# Patient Record
Sex: Female | Born: 1980 | Smoking: Never smoker
Health system: Southern US, Community
[De-identification: ages and names within clinical notes are randomized; demographics above are authoritative.]

## PROBLEM LIST (undated history)

## (undated) DIAGNOSIS — F419 Anxiety disorder, unspecified: Secondary | ICD-10-CM

## (undated) DIAGNOSIS — F329 Major depressive disorder, single episode, unspecified: Secondary | ICD-10-CM

## (undated) DIAGNOSIS — F909 Attention-deficit hyperactivity disorder, unspecified type: Secondary | ICD-10-CM

## (undated) DIAGNOSIS — F32A Depression, unspecified: Secondary | ICD-10-CM

## (undated) HISTORY — DX: Major depressive disorder, single episode, unspecified: F32.9

## (undated) HISTORY — PX: WISDOM TOOTH EXTRACTION: SHX21

## (undated) HISTORY — DX: Attention-deficit hyperactivity disorder, unspecified type: F90.9

## (undated) HISTORY — DX: Depression, unspecified: F32.A

---

## 2013-12-28 ENCOUNTER — Ambulatory Visit (INDEPENDENT_AMBULATORY_CARE_PROVIDER_SITE_OTHER): Payer: 59 | Admitting: Psychiatry

## 2013-12-28 ENCOUNTER — Encounter (HOSPITAL_COMMUNITY): Payer: Self-pay | Admitting: Psychiatry

## 2013-12-28 VITALS — BP 116/78 | HR 98 | Ht 69.0 in | Wt 148.0 lb

## 2013-12-28 DIAGNOSIS — F331 Major depressive disorder, recurrent, moderate: Secondary | ICD-10-CM

## 2013-12-28 MED ORDER — CLONAZEPAM 0.5 MG PO TABS: 0.5000 mg | ORAL_TABLET | Freq: Two times a day (BID) | ORAL | Status: DC | PRN

## 2013-12-28 MED ORDER — LISDEXAMFETAMINE DIMESYLATE 30 MG PO CAPS: 30.0000 mg | ORAL_CAPSULE | Freq: Every day | ORAL | Status: DC

## 2013-12-28 MED ORDER — BUPROPION HCL ER (XL) 300 MG PO TB24: 300.0000 mg | ORAL_TABLET | Freq: Every day | ORAL | Status: DC

## 2013-12-28 NOTE — Progress Notes (Signed)
Patient ID: Dawn Moreno, female   DOB: 08/13/1980, 33 y.o.   MRN: [id number]  [facility] Initial Assessment Note  Dawn Moreno [id number] 33 y.o.  12/28/2013 2:57 PM  Chief Complaint:  I need to establish my care.  I recently moved and I need a new psychiatrist.  History of Present Illness:  Patient is 33 year old Caucasian, employed married female who is self-referred,  came today with her husband her seeking management of her psychiatric illness.  Patient has a long history of depression and anxiety symptoms.  She was seeing a psychiatrist in [place] until recently she moved to this area and now need a new psychiatrist.  Currently she is taking Wellbutrin 450 mg in the morning, Deplin 50 mg daily, Zoloft 25 mg daily and Klonopin 0.5 mg twice a day as needed.  Patient endorses despite taking these medications she continues to have lack of motivation, difficulty doing multitasking, unable to focus and organize herself.  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  She started this job 2 months ago and she admitted having issues at work.  She gets easily overwhelmed and then get very anxious.  She endorsed sometimes feeling hopeless, crying spells, lack of interest and excessive worries.  She remembered in the past taking high dose of Adderall however it was discontinued when she started abusing them and running out before her next refill.  She remembered taking Adderall 90 mg which was given by her psychiatrist in [place].  She admitted going through withdrawal and her husband decided not to take anymore.  Patient currently feels her depression is under control however she is to have anxiety and lack of motivation.  Her energy level is low.  She denies any paranoia, hallucination, active or passive suicidal thoughts or homicidal thoughts.  Patient told that she has diagnosed in the past with bipolar disorder by 2 psychiatrist however she do not  have any symptoms of mania or any impulsive behavior.  She never had any psychological testing.  She mentioned that she was given Adderall and Vyvanse in the past however she was not sure if she ever diagnosed with ADD.  Patient admitted sometime poor sleep and racing thoughts but denies any anhedonia, hopeless or worthless feeling.  Patient appears overwhelmed as she is concerned about her job as she is unable to complete the task on time.  Patient does drink alcohol 2-3 times a week but denies any binge drinking or any withdrawal symptoms.  She denies any illegal substance use.  She denies any paranoia or any hallucination.  She denies any history of sexual, well-built, emotional abuse.  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  In the beginning she was given Xanax by her primary care physician and then she saw psychiatrist in 2010 who started her on Zyprexa, Prozac, Lamictal, Xanax and Adderall.  In 2012 she was given Abilify by a different psychiatrist and she was given Vyvanse along with Adderall.  Patient has significant withdrawal symptoms when she was running out of Adderall.  She admitted using Adderall more than she was prescribed because she was really tired.  She admitted to history of irritability, impulsive behavior when she was on  Adderall.  She remembers Zyprexa Prozac and Lamictal was making her more tired and her psychiatrist were giving Adderall to help the energy level.  In January 2015 she did intensive outpatient program and partial hospitalization program at [facility] in [place].  At that time her Adderall was discontinued and she was started on Wellbutrin which was gradually increased to 450 mg.   Later her primary care physician added Deplin 15 mg in June 2015.  She is also taking Klonopin 0.5 mg which she takes up to 1 mg as needed to help the anxiety.  This denies any history of suicidal attempt, inpatient psych treatment, paranoia, hallucination or any manic like symptoms. Anxiety: Yes Bipolar Disorder: Patient has diagnosed bipolar disorder by 2 previous psychiatrists however she denies any history of mania and psychosis. Depression: Yes Mania: No Psychosis: No Schizophrenia: No Personality Disorder: No Hospitalization for psychiatric illness: No History of Electroconvulsive Shock Therapy: No Prior Suicide Attempts: No  Medical History; Patient is prediabetic.  She does not have any active medical illness.  She has no primary care physician.  Traumatic brain injury: Patient denies any history of dramatic brain injury.  Family History; Patient endorsed father has depression.  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  Psychosocial History; Patient was born in [place] and grew up in [place].  She had moved to places in the past.  She's mad at him 2008.  Her husband is very supportive.  She has no children.  Her father is deceased.  Her mother lives in [place].  She recently moved from [place] with her husband because of new job.  Legal History; Patient denies any history of legal issues.  History Of Abuse; Patient denies any history of abuse.  Substance Abuse History; Patient endorsed occasional social drinking but denies any binge drinking, patrols, tremors, shakes or any intoxication.  She denies any illegal substance use.   Review of Systems: Psychiatric: Agitation: No Hallucination: No Depressed Mood: Yes Insomnia: Yes Hypersomnia: No Altered Concentration: No Feels Worthless: No Grandiose Ideas: No Belief In Special Powers: No New/Increased Substance Abuse:  No Compulsions: No  Neurologic: Headache: No Seizure: No Paresthesias: No   Musculoskeletal: Strength & Muscle Tone: within normal limits Gait & Station: unsteady Patient leans: N/A   Outpatient Encounter Prescriptions as of 12/28/2013  Medication Sig  . buPROPion (WELLBUTRIN XL) 300 MG 24 hr tablet Take 1 tablet (300 mg total) by mouth daily.  . clonazePAM (KLONOPIN) 0.5 MG tablet Take 1 tablet (0.5 mg total) by mouth 2 (two) times daily as needed for anxiety.  Marland Kitchen L-Methylfolate-Algae (DEPLIN 15 PO) Take 15 mg by mouth.  . sertraline (ZOLOFT) 25 MG tablet Take 25 mg by mouth daily.  . [DISCONTINUED] buPROPion (WELLBUTRIN XL) 150 MG 24 hr tablet Take 450 mg by mouth daily.  . [DISCONTINUED] clonazePAM (KLONOPIN) 0.5 MG tablet Take 0.5 mg by mouth 2 (two) times daily as needed for anxiety.  Marland Kitchen lisdexamfetamine (VYVANSE) 30 MG capsule Take 1 capsule (30 mg total) by mouth daily.    No results found for this or any previous visit (from the past 2160 hour(s)).    Constitutional:  BP 116/78  Pulse 98  Ht  (1.753 m)  Wt 148 lb (67.132 kg)  BMI 21.85 kg/m2   Mental Status Examination;  Patient is well groomed and well dressed female who appears to be her stated age.  She is anxious but  cooperative.  She is easily tearful when she is talking about her past history and symptoms of depression and anxiety.  Her thought process logical and goal-directed.  She describes her mood anxious and her affect is mood appropriate.  She denies any auditory or visual hallucination.  She denies any active or passive suicidal thoughts or homicidal thoughts.  There were no paranoia, delusions or any obsessive thoughts.  Her attention and concentration is fair.  She has some difficulty organizing her thoughts.  Her psychomotor activity is normal.  There were no tremors or shakes.  Her fund of knowledge is good.  She is alert and oriented x3.  There were no flight of ideas or any loose association.  Her  insight judgment and impulse control is good   Established Problem, Stable/Improving (1), Review of Psycho-Social Stressors (1), Decision to obtain old records (1), Review and summation of old records (2), Established Problem, Worsening (2), Review of Medication Regimen & Side Effects (2) and Review of New Medication or Change in Dosage (2)  Assessment: Axis I: Mood disorder NOS, rule out major depressive disorder recurrent, rule out ADHD  Axis II: Deferred  Axis III:  History reviewed. No pertinent past medical history.  Axis IV: Moderate   Plan:  I review her symptoms, history, medication and the stressors.  Despite taking Wellbutrin 450 mg and Zoloft she continues to experience anxiety symptoms with lack of focus and lack of motivation.  She admitted had a good response to the stimulant however at that time she was taking very high dose Adderall and she was running out before her next prescription.  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  I recommended to cut down her Wellbutrin 300 mg and try low-dose Vyvanse 30 mg to help attention and concentration.  I also suggested to have psychological testing to help the diagnosis.  The patient and her husband agree with the plan.  We will scheduled appointment with Dr Kieth Brightly for testing.  I will also get records from her previous psychiatrist including any blurred results if they have done.  At this time patient is not interested in counseling however would consider on her next appointment.  Recommended to use Klonopin as needed 0.5 mg twice a day.  We talked about stimulant abuse and withdrawal in detail.  Patient will not get any early refills of benzodiazepine or stimulants.  Recommended to call us back if she has any question or any concern.  I will see her again in 3 weeks. Time spent 55 minutes.  More than 50% of the time spent in psychoeducation, counseling and  coordination of care.  Discuss safety plan that anytime having active suicidal thoughts or homicidal thoughts then patient need to call 911 or go to the local emergency room.    Trejon Duford T., MD 12/28/2013

## 2014-01-05 ENCOUNTER — Other Ambulatory Visit (HOSPITAL_COMMUNITY): Payer: Self-pay | Admitting: Psychiatry

## 2014-01-06 ENCOUNTER — Other Ambulatory Visit (HOSPITAL_COMMUNITY): Payer: Self-pay | Admitting: Psychiatry

## 2014-01-06 MED ORDER — SERTRALINE HCL 25 MG PO TABS: 25.0000 mg | ORAL_TABLET | Freq: Every day | ORAL | Status: DC

## 2014-01-19 ENCOUNTER — Ambulatory Visit (HOSPITAL_COMMUNITY): Payer: 59 | Admitting: Psychology

## 2014-01-21 ENCOUNTER — Other Ambulatory Visit (HOSPITAL_COMMUNITY): Payer: Self-pay | Admitting: *Deleted

## 2014-01-21 NOTE — Telephone Encounter (Signed)
Patient called for Vyvanse refill. Advised pt to wait until appt on 01/26/13 for refill, as could not get refilled unti. 9/23 anyway.Pt thanked this Clinical research associate for [employer].Stated she did not realize her appt was 9/22.Gave pt appt time.Will get Rx then.

## 2014-01-26 ENCOUNTER — Encounter (HOSPITAL_COMMUNITY): Payer: Self-pay

## 2014-01-26 ENCOUNTER — Encounter (HOSPITAL_COMMUNITY): Payer: Self-pay | Admitting: Psychiatry

## 2014-01-26 ENCOUNTER — Ambulatory Visit (INDEPENDENT_AMBULATORY_CARE_PROVIDER_SITE_OTHER): Payer: BC Managed Care – PPO | Admitting: Psychiatry

## 2014-01-26 VITALS — BP 118/81 | HR 73 | Ht 68.0 in | Wt 150.0 lb

## 2014-01-26 DIAGNOSIS — F331 Major depressive disorder, recurrent, moderate: Secondary | ICD-10-CM

## 2014-01-26 DIAGNOSIS — F39 Unspecified mood [affective] disorder: Secondary | ICD-10-CM

## 2014-01-26 MED ORDER — BUPROPION HCL ER (XL) 300 MG PO TB24: 300.0000 mg | ORAL_TABLET | Freq: Every day | ORAL | Status: DC

## 2014-01-26 MED ORDER — CLONAZEPAM 0.5 MG PO TABS: 0.5000 mg | ORAL_TABLET | Freq: Two times a day (BID) | ORAL | Status: DC | PRN

## 2014-01-26 MED ORDER — LISDEXAMFETAMINE DIMESYLATE 40 MG PO CAPS: 40.0000 mg | ORAL_CAPSULE | Freq: Every day | ORAL | Status: DC

## 2014-01-26 MED ORDER — SERTRALINE HCL 25 MG PO TABS: 25.0000 mg | ORAL_TABLET | Freq: Every day | ORAL | Status: DC

## 2014-01-26 NOTE — Progress Notes (Addendum)
[facility] [id number] Progress Note   Dawn Moreno [id number] 33 y.o.  01/26/2014 5:11 PM  Chief Complaint:  I like Vyvanse I feel very tired in the afternoon.  History of Present Illness:  Dawn Moreno came for her followup appointment.  She was seen 4 weeks ago at initial evaluation for the management of depression and ADHD.  Patient is a 33 year old Caucasian female who is self-referred and she is seeking management for her psychiatric illness.  On her visit we recommended to decrease Wellbutrin 300 and a started Vyvanse as patient is not happy with the Adderall.  She was recommended to stop Deplin .  She wants to continue Zoloft 25 mg daily and Klonopin 0.5 mg twice a day.  Since she is taking Vyvanse she is feeling more energy, more motivation and able to do multitasking.  She is able to focus and organize herself.  However in the afternoon she gets really tired and fatigued.  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  She gets sometimes overwhelmed and bloodied.  Patient and her husband recently moved from [place].  We are still waiting for records from her previous psychiatrist.  Patient denies any paranoia, hallucination, chest pain or any active or passive suicidal thoughts or homicidal thoughts.  Patient is scheduled to see a psychologist for psychological testing on October 8.  Patient is sleeping good.  Her appetite and bottles are stable.  The patient does not drink or use any illegal substances.    Suicidal Ideation: No Plan Formed: No Patient has means to carry out plan: No  Homicidal Ideation: No Plan Formed: No Patient has means to carry out plan: No   Past Psychiatric History/Hospitalization(s) She endorsed history of depression since 2007 when she started job .  In the beginning she was given Xanax by her primary care physician.  In 2010 she saw psychiatrist who started her on Zyprexa, Prozac, the victim, Xanax and Adderall.  In  2012 she was given Abilify by different psychiatrist and she also given Vyvanse and Adderall.  The patient has significant withdrawal symptoms when she was about the Adderall.  She admitted history of irritability, impulsive behavior and she was on Adderall.  She remembers Zyprexa Prozac and Lamictal was making her more tired and her psychiatrist for giving Adderall to help in energy level.  In 2015 she had intensive outpatient program and partial hospitalization treatment at [facility] in [place].  Her last prescription was Wellbutrin 450 mg , Klonopin 0.5 mg twice a day and Adderall 20 mg twice a day.  Patient denies any history of suicidal attempt , mania, psychosis , hallucination or any inpatient psychiatric treatment.   Anxiety: Yes Bipolar Disorder: Patient has diagnosed bipolar disorder by 2 previous psychiatrists however she denies any history of mania and psychosis. Depression: Yes Mania: No Psychosis: No Schizophrenia: No Personality Disorder: No Hospitalization for psychiatric illness: No History of Electroconvulsive Shock Therapy: No Prior Suicide Attempts: No  Medical History; Patient is prediabetic.  She does not have any active medical illness.  She has no primary care physician.  Review of Systems: Psychiatric: Agitation: No Hallucination: No Depressed Mood: No Insomnia: No Hypersomnia: No Altered Concentration: No Feels Worthless: No Grandiose Ideas: No Belief In Special Powers: No New/Increased Substance Abuse: No Compulsions: No  Neurologic: Headache: No Seizure: No Paresthesias: No   Musculoskeletal: Strength & Muscle Tone: within normal limits Gait & Station: unsteady Patient leans: N/A   Outpatient Encounter Prescriptions as of  01/26/2014  Medication Sig  . buPROPion (WELLBUTRIN XL) 300 MG 24 hr tablet Take 1 tablet (300 mg total) by mouth daily.  . clonazePAM (KLONOPIN) 0.5 MG tablet Take 1 tablet (0.5 mg total) by mouth 2 (two) times daily as  needed for anxiety.  Marland Kitchen L-Methylfolate-Algae (DEPLIN 15 PO) Take 15 mg by mouth.  . lisdexamfetamine (VYVANSE) 40 MG capsule Take 1 capsule (40 mg total) by mouth daily.  . sertraline (ZOLOFT) 25 MG tablet Take 1 tablet (25 mg total) by mouth daily.  . [DISCONTINUED] buPROPion (WELLBUTRIN XL) 300 MG 24 hr tablet Take 1 tablet (300 mg total) by mouth daily.  . [DISCONTINUED] clonazePAM (KLONOPIN) 0.5 MG tablet Take 1 tablet (0.5 mg total) by mouth 2 (two) times daily as needed for anxiety.  . [DISCONTINUED] lisdexamfetamine (VYVANSE) 30 MG capsule Take 1 capsule (30 mg total) by mouth daily.  . [DISCONTINUED] sertraline (ZOLOFT) 25 MG tablet Take 1 tablet (25 mg total) by mouth daily.    No results found for this or any previous visit (from the past 2160 hour(s)).    Constitutional:  BP 118/81  Pulse 73  Ht  (1.727 m)  Wt 150 lb (68.04 kg)  BMI 22.81 kg/m2   Mental Status Examination;  Patient is well groomed and well dressed female who appears to be her stated age.  She is  calm and cooperative.  She described her mood good and her affect is mood appropriate.  She denies any auditory or visual hallucination.  She denies any active or passive suicidal thoughts or homicidal thoughts.  There were no paranoia, delusions or any obsessive thoughts.  Her attention and concentration is fair.  Her psychomotor activity is normal.  There were no tremors or shakes.  Her fund of knowledge is good.  She is alert and oriented x3.  There were no flight of ideas or any loose association.  Her insight judgment and impulse control is good   Established Problem, Stable/Improving (1), Review of Psycho-Social Stressors (1), Decision to obtain old records (1), Review and summation of old records (2), Review of Medication Regimen & Side Effects (2) and Review of New Medication or Change in Dosage (2)   Assessment: Axis I: Mood disorder NOS, rule out major depressive disorder recurrent, rule out ADHD  Axis  II: Deferred  Axis III:  History reviewed. No pertinent past medical history.  Axis IV: Moderate   Plan:  Patient is feeling better with vyvanse 30 mg and wellbutrin 300 mg.  She does not report any side effects however she feels more tired and fatigued in the afternoon.  I recommended to increase Vyvanse 40 mg and continue Wellbutrin and Klonopin at present dose.  We are still awaiting collateral information from her previous psychiatrist.  Patient is scheduled to see a psychologist on October 8 for psychological testing.  We will do blood work on her next visit if we do not receive any records.  Recommended to call us back if she has any question or any concern.  I will see her again in 4 weeks.  I discussed stimulant abuse and withdrawal in detail.  Patient will not get any early refills of benzodiazepine or stimulants. Time spent 25 minutes.  More than 50% of the time spent in psychoeducation, counseling and coordination of care.  Discuss safety plan that anytime having active suicidal thoughts or homicidal thoughts then patient need to call 911 or go to the local emergency room.    Addysen Louth T.,  MD 01/26/2014

## 2014-02-09 ENCOUNTER — Telehealth (HOSPITAL_COMMUNITY): Payer: Self-pay | Admitting: *Deleted

## 2014-02-09 NOTE — Telephone Encounter (Signed)
Pt states she had some medication changes during last visit and it seemed to help but no it is not working at all. She becomes exhausted and lethargic around 1pm every afternoon. 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 She is afraid she will not be able to focus on her work due to being so tired. Asking if anything can be done or will she have to wait until her next appointment.

## 2014-02-10 ENCOUNTER — Telehealth (HOSPITAL_COMMUNITY): Payer: Self-pay

## 2014-02-10 ENCOUNTER — Telehealth (HOSPITAL_COMMUNITY): Payer: Self-pay | Admitting: Psychiatry

## 2014-02-10 ENCOUNTER — Other Ambulatory Visit (HOSPITAL_COMMUNITY): Payer: Self-pay | Admitting: Psychiatry

## 2014-02-10 DIAGNOSIS — F331 Major depressive disorder, recurrent, moderate: Secondary | ICD-10-CM

## 2014-02-10 MED ORDER — LISDEXAMFETAMINE DIMESYLATE 20 MG PO CAPS
20.0000 mg | ORAL_CAPSULE | Freq: Every day | ORAL | Status: DC
Start: 2014-02-10 — End: 2014-02-25

## 2014-02-10 NOTE — Telephone Encounter (Signed)
I returned patient's phone call.  She is complaining of increased fatigue and feeling tired around noontime.  She wants to try higher dose of stimulants.  She is taking Vyvanse 40 mg.  I will add 20 mg Vyvanse at noontime.  Recommended to call us back if she feels any side effects or worsening of the symptoms.

## 2014-02-10 NOTE — Telephone Encounter (Signed)
I returned patient's phone call and left a message to call us back. 

## 2014-02-11 ENCOUNTER — Ambulatory Visit (HOSPITAL_COMMUNITY): Payer: 59 | Admitting: Psychology

## 2014-02-11 ENCOUNTER — Telehealth (HOSPITAL_COMMUNITY): Payer: Self-pay

## 2014-02-11 NOTE — Telephone Encounter (Signed)
Dawn Moreno picked up prescription on 02/11/14  [facility] [place] [id number]  dlo

## 2014-02-23 ENCOUNTER — Encounter: Payer: Self-pay | Admitting: Obstetrics & Gynecology

## 2014-02-23 ENCOUNTER — Ambulatory Visit (INDEPENDENT_AMBULATORY_CARE_PROVIDER_SITE_OTHER): Payer: BC Managed Care – PPO | Admitting: Obstetrics & Gynecology

## 2014-02-23 VITALS — BP 122/83 | HR 88 | Resp 16 | Ht 68.0 in | Wt 147.0 lb

## 2014-02-23 DIAGNOSIS — Z23 Encounter for immunization: Secondary | ICD-10-CM | POA: Diagnosis not present

## 2014-02-23 DIAGNOSIS — Z124 Encounter for screening for malignant neoplasm of cervix: Secondary | ICD-10-CM

## 2014-02-23 DIAGNOSIS — Z1151 Encounter for screening for human papillomavirus (HPV): Secondary | ICD-10-CM | POA: Diagnosis not present

## 2014-02-23 DIAGNOSIS — Z0000 Encounter for general adult medical examination without abnormal findings: Secondary | ICD-10-CM

## 2014-02-23 DIAGNOSIS — Z01419 Encounter for gynecological examination (general) (routine) without abnormal findings: Secondary | ICD-10-CM

## 2014-02-23 MED ORDER — LOESTRIN 24 FE 1-20 MG-MCG PO TABS: 1.0000 | ORAL_TABLET | Freq: Every day | ORAL | Status: DC

## 2014-02-23 NOTE — Progress Notes (Signed)
Subjective:    Dawn Moreno is a 33 y.o. MW G0 female who presents for an annual exam. The patient has no complaints today. The patient is sexually active. GYN screening history: last pap: was normal. The patient wears seatbelts: yes. The patient participates in regular exercise: yes. Has the patient ever been transfused or tattooed?: no. The patient reports that there is not domestic violence in her life.   Menstrual History: OB History   Grav Para Term Preterm Abortions TAB SAB Ect Mult Living                  Menarche age: [age]  Patient's last menstrual period was 02/02/2014.    The following portions of the patient's history were reviewed and updated as appropriate: allergies, current medications, past family history, past medical history, past social history, past surgical history and problem list.  Review of Systems A comprehensive review of systems was negative. 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 Married for 4 year, denies dyspareunia. She is on Lo-Estrin. Doesn't plan to have kids and uses OCPs for dysmenorrhea. Wants flu vaccine.   Objective:    BP 122/83  Pulse 88  Resp 16  Ht 5\' 8"  (1.727 m)  Wt 147 lb (66.679 kg)  BMI 22.36 kg/m2  LMP 02/02/2014  General Appearance:    Alert, cooperative, no distress, appears stated age  Head:    Normocephalic, without obvious abnormality, atraumatic  Eyes:    PERRL, conjunctiva/corneas clear, EOM's intact, fundi    benign, both eyes  Ears:    Normal TM's and external ear canals, both ears  Nose:   Nares normal, septum midline, mucosa normal, no drainage    or sinus tenderness  Throat:   Lips, mucosa, and tongue normal; teeth and gums normal  Neck:   Supple, symmetrical, trachea midline, no adenopathy;    thyroid:  no enlargement/tenderness/nodules; no carotid   bruit or JVD  Back:     Symmetric, no curvature, ROM normal, no CVA tenderness  Lungs:     Clear to auscultation bilaterally, respirations unlabored  Chest Wall:    No  tenderness or deformity   Heart:    Regular rate and rhythm, S1 and S2 normal, no murmur, rub   or gallop  Breast Exam:    No tenderness, masses, or nipple abnormality  Abdomen:     Soft, non-tender, bowel sounds active all four quadrants,    no masses, no organomegaly  Genitalia:    Normal female without lesion, discharge or tenderness, shaved, NSSmid plane, mobile, normal adnexal exam     Extremities:   Extremities normal, atraumatic, no cyanosis or edema  Pulses:   2+ and symmetric all extremities  Skin:   Skin color, texture, turgor normal, no rashes or lesions  Lymph nodes:   Cervical, supraclavicular, and axillary nodes normal  Neurologic:   CNII-XII intact, normal strength, sensation and reflexes    throughout  .    Assessment:    Healthy female exam.    Plan:     Breast self exam technique reviewed and patient encouraged to perform self-exam monthly. Thin prep Pap smear. with cotesting Flu vaccine

## 2014-02-25 ENCOUNTER — Ambulatory Visit (INDEPENDENT_AMBULATORY_CARE_PROVIDER_SITE_OTHER): Payer: BC Managed Care – PPO | Admitting: Psychiatry

## 2014-02-25 ENCOUNTER — Encounter (HOSPITAL_COMMUNITY): Payer: Self-pay | Admitting: Psychiatry

## 2014-02-25 VITALS — BP 133/83 | HR 90 | Ht 68.0 in | Wt 146.8 lb

## 2014-02-25 DIAGNOSIS — F39 Unspecified mood [affective] disorder: Secondary | ICD-10-CM

## 2014-02-25 DIAGNOSIS — Z79899 Other long term (current) drug therapy: Secondary | ICD-10-CM

## 2014-02-25 DIAGNOSIS — F331 Major depressive disorder, recurrent, moderate: Secondary | ICD-10-CM

## 2014-02-25 DIAGNOSIS — F988 Other specified behavioral and emotional disorders with onset usually occurring in childhood and adolescence: Secondary | ICD-10-CM

## 2014-02-25 MED ORDER — LISDEXAMFETAMINE DIMESYLATE 40 MG PO CAPS: 40.0000 mg | ORAL_CAPSULE | ORAL | Status: DC

## 2014-02-25 MED ORDER — LISDEXAMFETAMINE DIMESYLATE 20 MG PO CAPS: 20.0000 mg | ORAL_CAPSULE | Freq: Every day | ORAL | Status: DC

## 2014-02-25 MED ORDER — BUPROPION HCL ER (XL) 300 MG PO TB24: 300.0000 mg | ORAL_TABLET | Freq: Every day | ORAL | Status: DC

## 2014-02-25 MED ORDER — CLONAZEPAM 0.5 MG PO TABS: 0.5000 mg | ORAL_TABLET | Freq: Two times a day (BID) | ORAL | Status: DC | PRN

## 2014-02-25 MED ORDER — SERTRALINE HCL 25 MG PO TABS: 25.0000 mg | ORAL_TABLET | Freq: Every day | ORAL | Status: DC

## 2014-02-25 MED ORDER — LISDEXAMFETAMINE DIMESYLATE 20 MG PO CAPS
20.0000 mg | ORAL_CAPSULE | Freq: Every day | ORAL | Status: DC
Start: 2014-02-25 — End: 2014-02-25

## 2014-02-25 NOTE — Progress Notes (Signed)
[facility] Behavioral [facility] [address] Progress Note   Dawn Moreno [id number] 33 y.o.  02/25/2014 4:06 PM  Chief Complaint:  I am feeling better.  I like extra Vyvanse in the afternoon.  History of Present Illness:  Dawn Moreno came for her followup appointment.  She recently called because of increase feeling tired and lack of motivation to do things in the afternoon.  We have given 20 mg of Vyvanse in the afternoon along with 40 mg in the morning.  She has seen improvement.  Her attention and concentration is improved.  She is able to do multitasking.  She continues to take Wellbutrin, Zoloft and Klonopin however she has cut the Klonopin and only takes when she is very nervous and anxious.  She denies any shakes, tremors, insomnia or any side effects.  She denies any crying spells, irritability or any anger.  Sometimes she gets stress at school but overall she likes her job .  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  We are still waiting for records from her previous psychiatrist.  Patient is scheduled to have psychological testing on November 3.  She has been rescheduled because she was not feeling well on her previous appointment.  She lives with her husband who is very supportive.  Patient does not drink or use any illegal substances. Her appetite and bottles are stable.    Suicidal Ideation: No Plan Formed: No Patient has means to carry out plan: No  Homicidal Ideation: No Plan Formed: No Patient has means to carry out plan: No   Past Psychiatric History/Hospitalization(s) She endorsed history of depression since 2007 when she started job .  In the beginning she was given Xanax by her primary care physician.  In 2010 she saw psychiatrist who started her on Zyprexa, Prozac, Wellbutrin, Xanax and Adderall.  In 2012 she was given Abilify by different psychiatrist and she also given Vyvanse and Adderall.  The patient has significant withdrawal symptoms  when she ran out from Adderall.  She admitted history of irritability, impulsive behavior when she was on Adderall.  She remembers Zyprexa Prozac and Lamictal was making her more tired and her psychiatrist was giving Adderall to help in energy level.  In 2015 she had intensive outpatient program and partial hospitalization treatment at [facility] in [place].  Her last prescription was Wellbutrin 450 mg , Klonopin 0.5 mg twice a day and Adderall 20 mg twice a day.  Patient denies any history of suicidal attempt , mania, psychosis , hallucination or any inpatient psychiatric treatment.   Anxiety: Yes Bipolar Disorder: Patient has diagnosed bipolar disorder by 2 previous psychiatrists however she denies any history of mania and psychosis. Depression: Yes Mania: No Psychosis: No Schizophrenia: No Personality Disorder: No Hospitalization for psychiatric illness: No History of Electroconvulsive Shock Therapy: No Prior Suicide Attempts: No  Medical History; Patient is prediabetic.  She does not have any active medical illness.  She has no primary care physician.  Review of Systems: Psychiatric: Agitation: No Hallucination: No Depressed Mood: No Insomnia: No Hypersomnia: No Altered Concentration: No Feels Worthless: No Grandiose Ideas: No Belief In Special Powers: No New/Increased Substance Abuse: No Compulsions: No  Neurologic: Headache: No Seizure: No Paresthesias: No   Musculoskeletal: Strength & Muscle Tone: within normal limits Gait & Station: unsteady Patient leans: N/A   Outpatient Encounter Prescriptions as of 02/25/2014  Medication Sig  . buPROPion (WELLBUTRIN XL) 300 MG 24 hr tablet Take 1 tablet (300 mg  total) by mouth daily.  . clonazePAM (KLONOPIN) 0.5 MG tablet Take 1 tablet (0.5 mg total) by mouth 2 (two) times daily as needed for anxiety.  Marland Kitchen. L-Methylfolate-Algae (DEPLIN 15 PO) Take 15 mg by mouth.  . lisdexamfetamine (VYVANSE) 20 MG capsule Take 1 capsule  (20 mg total) by mouth daily.  Marland Kitchen. lisdexamfetamine (VYVANSE) 40 MG capsule Take 1 capsule (40 mg total) by mouth every morning.  Marland Kitchen. LOESTRIN 24 FE 1-20 MG-MCG tablet Take 1 tablet by mouth daily.  Marland Kitchen. MICROGESTIN FE 1/20 1-20 MG-MCG tablet   . Multiple Vitamin (MULTIVITAMIN) capsule Take 1 capsule by mouth daily.  . sertraline (ZOLOFT) 25 MG tablet Take 1 tablet (25 mg total) by mouth daily.  . [DISCONTINUED] buPROPion (WELLBUTRIN XL) 300 MG 24 hr tablet Take 1 tablet (300 mg total) by mouth daily.  . [DISCONTINUED] clonazePAM (KLONOPIN) 0.5 MG tablet Take 1 tablet (0.5 mg total) by mouth 2 (two) times daily as needed for anxiety.  . [DISCONTINUED] lisdexamfetamine (VYVANSE) 20 MG capsule Take 1 capsule (20 mg total) by mouth daily.  . [DISCONTINUED] lisdexamfetamine (VYVANSE) 20 MG capsule Take 1 capsule (20 mg total) by mouth daily.  . [DISCONTINUED] lisdexamfetamine (VYVANSE) 40 MG capsule Take 1 capsule (40 mg total) by mouth every morning.  . [DISCONTINUED] sertraline (ZOLOFT) 25 MG tablet Take 1 tablet (25 mg total) by mouth daily.    No results found for this or any previous visit (from the past 2160 hour(s)).    Constitutional:  BP 133/83  Pulse 90  Ht 5\' 8"  (1.727 m)  Wt 146 lb 12.8 oz (66.588 kg)  BMI 22.33 kg/m2  LMP 02/02/2014   Mental Status Examination;  Patient is well groomed and well dressed female who appears to be her stated age.  She is  calm and cooperative.  She described her mood good and her affect is mood appropriate.  She denies any auditory or visual hallucination.  She denies any active or passive suicidal thoughts or homicidal thoughts.  There were no paranoia, delusions or any obsessive thoughts.  Her attention and concentration is improved from the past.  Her psychomotor activity is normal.  There were no tremors or shakes.  Her fund of knowledge is good.  She is alert and oriented x3.  There were no flight of ideas or any loose association.  Her insight  judgment and impulse control is good  Established Problem, Stable/Improving (1), Review of Psycho-Social Stressors (1), Review or order clinical lab tests (1), Review of Last Therapy Session (1), Review of Medication Regimen & Side Effects (2) and Review of New Medication or Change in Dosage (2)   Assessment: Axis I: Mood disorder NOS, rule out major depressive disorder recurrent, rule out ADHD  Axis II: Deferred  Axis III:  History reviewed. No pertinent past medical history.  Axis IV: Moderate   Plan:  Patient has shown improvement with additional 20 mg of vyvanse in the afternoon.  She does not have any side effects.  I will continue Vyvanse 40 mg in the morning and 20 mg in the afternoon.  Recommended to continue Wellbutrin, Zoloft and Klonopin at present dose.  Discussed medication side effects and benefits.  I will do CBC, CMP, hemoglobin A1c and lipid panel.  Patient is scheduled to see a psychologist on November 3 for psychological testing.  We will followup on that.  Recommended to call us back if she has any question noted a concern.  Followup in 2 months. I discussed  stimulant abuse and withdrawal in detail.  Patient will not get any early refills of benzodiazepine or stimulants. Time spent 25 minutes.  More than 50% of the time spent in psychoeducation, counseling and coordination of care.  Discuss safety plan that anytime having active suicidal thoughts or homicidal thoughts then patient need to call 911 or go to the local emergency room.    Jkai Arwood T., MD 02/25/2014

## 2014-02-26 LAB — CYTOLOGY - PAP

## 2014-03-03 ENCOUNTER — Telehealth: Payer: Self-pay | Admitting: *Deleted

## 2014-03-03 NOTE — Telephone Encounter (Addendum)
[facility] that pt was prescribed Lo Estrin Fe birth control but has discount card for Lo Lo Estrin Fe. They dispensed the Lo Lo Estrin and will continue to give that for her refills unless MD prefers the  LoEstrin as prescribed. I consulted with Dr. Marice Potterove and she stated the Lo Lo Estrin is acceptable. [facility] pharmacy notified.

## 2014-03-09 ENCOUNTER — Ambulatory Visit (INDEPENDENT_AMBULATORY_CARE_PROVIDER_SITE_OTHER): Payer: BC Managed Care – PPO | Admitting: Psychology

## 2014-03-09 DIAGNOSIS — F331 Major depressive disorder, recurrent, moderate: Secondary | ICD-10-CM

## 2014-03-31 ENCOUNTER — Ambulatory Visit (HOSPITAL_COMMUNITY): Payer: BC Managed Care – PPO | Admitting: Psychology

## 2014-04-06 ENCOUNTER — Other Ambulatory Visit (HOSPITAL_COMMUNITY): Payer: Self-pay | Admitting: Psychiatry

## 2014-04-08 ENCOUNTER — Ambulatory Visit (HOSPITAL_COMMUNITY): Payer: Self-pay | Admitting: Psychology

## 2014-04-19 ENCOUNTER — Encounter (HOSPITAL_COMMUNITY): Payer: Self-pay | Admitting: Psychiatry

## 2014-04-19 ENCOUNTER — Ambulatory Visit (INDEPENDENT_AMBULATORY_CARE_PROVIDER_SITE_OTHER): Payer: BC Managed Care – PPO | Admitting: Psychiatry

## 2014-04-19 VITALS — BP 133/89 | HR 86 | Ht 68.0 in | Wt 150.2 lb

## 2014-04-19 DIAGNOSIS — F39 Unspecified mood [affective] disorder: Secondary | ICD-10-CM

## 2014-04-19 DIAGNOSIS — F988 Other specified behavioral and emotional disorders with onset usually occurring in childhood and adolescence: Secondary | ICD-10-CM

## 2014-04-19 DIAGNOSIS — F331 Major depressive disorder, recurrent, moderate: Secondary | ICD-10-CM

## 2014-04-19 MED ORDER — BUPROPION HCL ER (XL) 300 MG PO TB24: 300.0000 mg | ORAL_TABLET | Freq: Every day | ORAL | Status: DC

## 2014-04-19 MED ORDER — CLONAZEPAM 0.5 MG PO TABS
0.5000 mg | ORAL_TABLET | Freq: Two times a day (BID) | ORAL | Status: DC | PRN
Start: 2014-04-19 — End: 2014-04-19

## 2014-04-19 MED ORDER — CLONAZEPAM 0.5 MG PO TABS: 0.5000 mg | ORAL_TABLET | Freq: Two times a day (BID) | ORAL | Status: DC | PRN

## 2014-04-19 MED ORDER — LISDEXAMFETAMINE DIMESYLATE 40 MG PO CAPS: 40.0000 mg | ORAL_CAPSULE | ORAL | Status: DC

## 2014-04-19 MED ORDER — SERTRALINE HCL 25 MG PO TABS: 25.0000 mg | ORAL_TABLET | Freq: Every day | ORAL | Status: DC

## 2014-04-19 MED ORDER — LISDEXAMFETAMINE DIMESYLATE 20 MG PO CAPS: 20.0000 mg | ORAL_CAPSULE | Freq: Every day | ORAL | Status: DC

## 2014-04-19 NOTE — Progress Notes (Signed)
[facility] Behavioral [facility] [address] Progress Note   Dawn Moreno [id number] 33 y.o.  04/19/2014 3:45 PM  Chief Complaint:  Medication management and follow-up.  History of Present Illness:  Dawn Moreno came for her followup appointment.  She is taking her medication as prescribed.  However she did not had a blood work and she did not went back to see Dr. Shelva Majestic for psychological testing.  She sent paperwork however she did not put correct address and it came back.  She has sent all her paperwork to Dr. Elyn Aquas office again and hoping to get another appointment for psychological testing.  Overall her attention, focus is good.  She is able to do multitasking.  She had a good Thanksgiving and she is going to [place] on Christmas Christmas to visit family member.  She is very excited about it.  She wants to continue her current medication.  She denies any agitation, anger, irritability or any severe mood swings.  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  She likes her job.  She wants to continue Wellbutrin, Zoloft, Klonopin and deplin. Her appetite is okay.  Her vitals are stable.  She lives with her husband who is very supportive.  Patient denies drinking or using any illegal substances.   Suicidal Ideation: No Plan Formed: No Patient has means to carry out plan: No  Homicidal Ideation: No Plan Formed: No Patient has means to carry out plan: No   Past Psychiatric History/Hospitalization(s) She endorsed history of depression since 2007 when she started job .  In the beginning she was given Xanax by her primary care physician.  In 2010 she saw psychiatrist who started her on Zyprexa, Prozac, Wellbutrin, Xanax and Adderall.  In 2012 she was given Abilify by different psychiatrist and she also given Vyvanse and Adderall.  The patient has significant withdrawal symptoms when she ran out from Adderall.  She admitted history of irritability, impulsive  behavior when she was on Adderall.  She remembers Zyprexa Prozac and Lamictal was making her more tired and her psychiatrist was giving Adderall to help in energy level.  In 2015 she had intensive outpatient program and partial hospitalization treatment at [facility] in [place].  Her last prescription was Wellbutrin 450 mg , Klonopin 0.5 mg twice a day and Adderall 20 mg twice a day.  Patient denies any history of suicidal attempt , mania, psychosis , hallucination or any inpatient psychiatric treatment.   Anxiety: Yes Bipolar Disorder: Patient has diagnosed bipolar disorder by 2 previous psychiatrists however she denies any history of mania and psychosis. Depression: Yes Mania: No Psychosis: No Schizophrenia: No Personality Disorder: No Hospitalization for psychiatric illness: No History of Electroconvulsive Shock Therapy: No Prior Suicide Attempts: No  Medical History; Patient is prediabetic.  She does not have any active medical illness.  She has no primary care physician.  Review of Systems: Psychiatric: Agitation: No Hallucination: No Depressed Mood: No Insomnia: No Hypersomnia: No Altered Concentration: No Feels Worthless: No Grandiose Ideas: No Belief In Special Powers: No New/Increased Substance Abuse: No Compulsions: No  Neurologic: Headache: No Seizure: No Paresthesias: No   Musculoskeletal: Strength & Muscle Tone: within normal limits Gait & Station: unsteady Patient leans: N/A   Outpatient Encounter Prescriptions as of 04/19/2014  Medication Sig  . buPROPion (WELLBUTRIN XL) 300 MG 24 hr tablet Take 1 tablet (300 mg total) by mouth daily.  . clonazePAM (KLONOPIN) 0.5 MG tablet Take 1 tablet (0.5 mg total) by mouth 2 (two)  times daily as needed for anxiety.  Marland Kitchen. L-Methylfolate-Algae (DEPLIN 15 PO) Take 15 mg by mouth.  . lisdexamfetamine (VYVANSE) 20 MG capsule Take 1 capsule (20 mg total) by mouth daily.  Marland Kitchen. lisdexamfetamine (VYVANSE) 40 MG capsule Take 1  capsule (40 mg total) by mouth every morning.  Marland Kitchen. LOESTRIN 24 FE 1-20 MG-MCG tablet Take 1 tablet by mouth daily.  Marland Kitchen. MICROGESTIN FE 1/20 1-20 MG-MCG tablet   . Multiple Vitamin (MULTIVITAMIN) capsule Take 1 capsule by mouth daily.  . sertraline (ZOLOFT) 25 MG tablet Take 1 tablet (25 mg total) by mouth daily.  . [DISCONTINUED] buPROPion (WELLBUTRIN XL) 300 MG 24 hr tablet Take 1 tablet (300 mg total) by mouth daily.  . [DISCONTINUED] clonazePAM (KLONOPIN) 0.5 MG tablet Take 1 tablet (0.5 mg total) by mouth 2 (two) times daily as needed for anxiety.  . [DISCONTINUED] clonazePAM (KLONOPIN) 0.5 MG tablet Take 1 tablet (0.5 mg total) by mouth 2 (two) times daily as needed for anxiety.  . [DISCONTINUED] lisdexamfetamine (VYVANSE) 20 MG capsule Take 1 capsule (20 mg total) by mouth daily.  . [DISCONTINUED] lisdexamfetamine (VYVANSE) 20 MG capsule Take 1 capsule (20 mg total) by mouth daily.  . [DISCONTINUED] lisdexamfetamine (VYVANSE) 40 MG capsule Take 1 capsule (40 mg total) by mouth every morning.  . [DISCONTINUED] lisdexamfetamine (VYVANSE) 40 MG capsule Take 1 capsule (40 mg total) by mouth every morning.  . [DISCONTINUED] sertraline (ZOLOFT) 25 MG tablet Take 1 tablet (25 mg total) by mouth daily.    Recent Results (from the past 2160 hour(s))  Cytology - PAP     Status: None   Collection Time: 02/23/14 12:00 AM  Result Value Ref Range   CYTOLOGY - PAP PAP RESULT       Constitutional:  BP 133/89 mmHg  Pulse 86  Ht 5\' 8"  (1.727 m)  Wt 150 lb 3.2 oz (68.13 kg)  BMI 22.84 kg/m2   Mental Status Examination;  Patient is well groomed and well dressed female who appears to be her stated age.  She is calm and cooperative.  She described her mood good and her affect is mood appropriate.  She denies any auditory or visual hallucination.  She denies any active or passive suicidal thoughts or homicidal thoughts.  There were no paranoia, delusions or any obsessive thoughts.  Her attention and  concentration is improved from the past.  Her psychomotor activity is normal.  There were no tremors or shakes.  Her fund of knowledge is good.  She is alert and oriented x3.  There were no flight of ideas or any loose association.  Her insight judgment and impulse control is good  Established Problem, Stable/Improving (1), Review of Psycho-Social Stressors (1), Review of Last Therapy Session (1) and Review of Medication Regimen & Side Effects (2)   Assessment: Axis I: Mood disorder NOS, rule out major depressive disorder recurrent, rule out ADHD  Axis II: Deferred  Axis III:  History reviewed. No pertinent past medical history.  Axis IV: Moderate   Plan:  Reinforce blood work and psychological testing.  At this time we will not change any medication.  Continue Vyvanse 40 mg in the morning and 20 mg in the afternoon, Klonopin 0.5 mg twice a day, Wellbutrin XL 300 mg daily and Zoloft 25 mg daily.  Discussed medication side effects.  Recommended to call us back if she has any question, concern or if she feels worsening of the symptom.  Discussed benzodiazepine or stimulants withdrawal, tolerance and abuse.  Follow-up in 2 months.  Raffael Bugarin T., MD 04/19/2014

## 2014-04-20 ENCOUNTER — Telehealth (HOSPITAL_COMMUNITY): Payer: Self-pay | Admitting: *Deleted

## 2014-04-22 ENCOUNTER — Encounter (HOSPITAL_COMMUNITY): Payer: Self-pay | Admitting: Psychology

## 2014-04-22 NOTE — Progress Notes (Signed)
Patient:   Dawn Moreno   DOB:   [birth year]  MR Number:  [id number]  Location:  [facility] [address] [place] [address] Dept: [phone]           Date of Service:   03/09/2014  Start Time:   3 PM End Time:   4 PM  Provider/Observer:  Hershal CoriaJohn R Rodenbough PSYD       Billing Code/Service: [id number]  Chief Complaint:     Chief Complaint  Patient presents with  . ADD  . Depression  . Anxiety    Reason for Service:  The patient was referred by Dr. [name] for psychological testing. The patient reports that she has dealt with anxiety and depression for quite some time and is also been treated for attention deficit disorder in the past. She has been followed by both psychiatry as well as having counseling and therapy. The patient reports that she has been on multiple medications and combinations of medications. The patient reports that she is now taking Vyvanse and feels like it is very helpful. However, she reports that she is also had significant episodes of depression. She had a very bad episode in January of depression and reports that taking Wellbutrin helped with the withdrawal and changes after stopping Adderall. The patient reports that she continues to have great difficulty with focus and attention is very frustrated by her inability to maintain attention concentration. She reports that overall her depression is much better now.  The patient reports that historically that she was born after normal pregnancy and went full-term. She was told it was at the delivery. There were no major illnesses as a child she had a normal developmental milestones including appropriate development of speech and motor functions. The patient reports that she did quite well in school and was tested for advanced placement. However, she reports that she was always very talkative at school and somewhat hyper but is never a  behavioral problem in school.  Current Status:  The patient describes moderate significant symptoms of attentional difficulties but also continuing mild depression. She does acknowledge that anxiety has been worse and feels like it was predominated by a recent cancer scare. She reports that she always has difficulty in his worsening difficulties getting things done in motivating as well as difficulty for concentration.  Reliability of Information: Information is provided by the patient as well as review of available medical records.  Behavioral Observation: Dawn Moreno  presents as a 33 y.o.-year-old Right Caucasian Female who appeared her stated age. her dress was Appropriate and she was Well Groomed and her manners were Appropriate to the situation.  There were not any physical disabilities noted.  she displayed an appropriate level of cooperation and motivation.    Interactions:    Active   Attention:   within normal limits  Memory:   within normal limits  Visuo-spatial:   within normal limits  Speech (Volume):  normal  Speech:   normal pitch  Thought Process:  Coherent  Though Content:  WNL  Orientation:   person, place, time/date and situation  Judgment:   Good  Planning:   Good  Affect:    Anxious  Mood:    Anxious  Insight:   Good  Intelligence:   high  Marital Status/Living: The patient reports that she was born and raised in [place]. Her father is deceased and her mother continues to live in [place]. The patient is  married and has no children and currently lives with her husband.  Current Employment: The patient is currently working in education as a Loss adjuster, charteredlanguage specialist. She reports that she enjoys this job.  Past Employment:    Substance Use:  No concerns of substance abuse are reported.  the patient does report that she had an issue with Adderall in the past and would take more than was prescribed. However, she reports that she was taking these because of  excessive fatigue and lack of concentration. She has discontinued this activity.  Education:   College  Medical History:  History reviewed. No pertinent past medical history.      Outpatient Encounter Prescriptions as of 03/09/2014  Medication Sig  . L-Methylfolate-Algae (DEPLIN 15 PO) Take 15 mg by mouth.  . LOESTRIN 24 FE 1-20 MG-MCG tablet Take 1 tablet by mouth daily.  Marland Kitchen. MICROGESTIN FE 1/20 1-20 MG-MCG tablet   . Multiple Vitamin (MULTIVITAMIN) capsule Take 1 capsule by mouth daily.          Sexual History:   History  Sexual Activity  . Sexual Activity: Yes  . Birth Control/ Protection: Pill    Abuse/Trauma History: The patient denies any history of abuse or trauma.  Psychiatric History:  The patient does have a long history of back those with depression anxiety and is also been treated for attention deficit disorder for quite some time. She did have a time where she was overusing her Adderall in the self-medicating.  Family Med/Psych History:  Family History  Problem Relation Age of Onset  . Depression Father     Risk of Suicide/Violence: virtually non-existent the patient denies any suicidal or homicidal ideation  Impression/DX:  The patient does have a long history of difficulties with attention concentration, anxiety, and depression. Is unclear whether the attentional issues and motivational issues are related to underlying issues related to depression and anxiety or if there is an underlying attention deficit disorder. Her treating psychiatrist would like an evaluation to assess for these differential diagnoses.  Disposition/Plan:  We will set the patient up for formal psychological testing utilizing the comprehensive attention battery.  Diagnosis:    Axis I:  Major depressive disorder, recurrent episode, moderate 03/09/2014

## 2014-05-03 ENCOUNTER — Telehealth (HOSPITAL_COMMUNITY): Payer: Self-pay | Admitting: *Deleted

## 2014-05-03 NOTE — Telephone Encounter (Signed)
pt called and lm stating she need refills for her Vyvanse 20 mg because she is out of refills. Pt stated she is out of town and her husband will be coming to office to pick up printed script. Called pt and lmtcb due to voicemail. number provided. Per pt chart, pt medication was last printed 04-19-14 with 30 tablets 0 refills. Pt was last seen 04-19-14.

## 2014-05-12 ENCOUNTER — Other Ambulatory Visit (HOSPITAL_COMMUNITY): Payer: Self-pay | Admitting: Psychiatry

## 2014-05-12 DIAGNOSIS — F988 Other specified behavioral and emotional disorders with onset usually occurring in childhood and adolescence: Secondary | ICD-10-CM

## 2014-05-12 MED ORDER — LISDEXAMFETAMINE DIMESYLATE 40 MG PO CAPS: 40.0000 mg | ORAL_CAPSULE | ORAL | Status: DC

## 2014-05-14 ENCOUNTER — Telehealth (HOSPITAL_COMMUNITY): Payer: Self-pay

## 2014-05-14 NOTE — Telephone Encounter (Signed)
LATE ENTRY 05/14/14 - Patient's husband Rosezetta SchlatterJoseph [place] #[phone] came and pick- rx script.Marland Kitchen.Marguerite Olea/sh

## 2014-06-01 ENCOUNTER — Telehealth (HOSPITAL_COMMUNITY): Payer: Self-pay | Admitting: Psychiatry

## 2014-06-01 LAB — CBC WITH DIFFERENTIAL/PLATELET
BASOS PCT: 0 % (ref 0–1)
Basophils Absolute: 0 10*3/uL (ref 0.0–0.1)
EOS ABS: 0.1 10*3/uL (ref 0.0–0.7)
Eosinophils Relative: 1 % (ref 0–5)
HCT: 41.8 % (ref 36.0–46.0)
Hemoglobin: 14.6 g/dL (ref 12.0–15.0)
LYMPHS PCT: 33 % (ref 12–46)
Lymphs Abs: 2.5 10*3/uL (ref 0.7–4.0)
MCH: 30.7 pg (ref 26.0–34.0)
MCHC: 34.9 g/dL (ref 30.0–36.0)
MCV: 88 fL (ref 78.0–100.0)
MONOS PCT: 7 % (ref 3–12)
Monocytes Absolute: 0.5 10*3/uL (ref 0.1–1.0)
Neutro Abs: 4.4 10*3/uL (ref 1.7–7.7)
Neutrophils Relative %: 59 % (ref 43–77)
Platelets: 301 10*3/uL (ref 150–400)
RBC: 4.75 MIL/uL (ref 3.87–5.11)
RDW: 12.5 % (ref 11.5–15.5)
WBC: 7.5 10*3/uL (ref 4.0–10.5)

## 2014-06-02 ENCOUNTER — Ambulatory Visit (INDEPENDENT_AMBULATORY_CARE_PROVIDER_SITE_OTHER): Payer: BLUE CROSS/BLUE SHIELD | Admitting: Psychiatry

## 2014-06-02 ENCOUNTER — Encounter (HOSPITAL_COMMUNITY): Payer: Self-pay | Admitting: Psychiatry

## 2014-06-02 VITALS — BP 118/70 | HR 93 | Ht 68.0 in | Wt 144.4 lb

## 2014-06-02 DIAGNOSIS — F988 Other specified behavioral and emotional disorders with onset usually occurring in childhood and adolescence: Secondary | ICD-10-CM

## 2014-06-02 DIAGNOSIS — F39 Unspecified mood [affective] disorder: Secondary | ICD-10-CM

## 2014-06-02 DIAGNOSIS — F331 Major depressive disorder, recurrent, moderate: Secondary | ICD-10-CM

## 2014-06-02 LAB — COMPREHENSIVE METABOLIC PANEL
ALBUMIN: 4.1 g/dL (ref 3.5–5.2)
ALT: 17 U/L (ref 0–35)
AST: 15 U/L (ref 0–37)
Alkaline Phosphatase: 65 U/L (ref 39–117)
BILIRUBIN TOTAL: 0.5 mg/dL (ref 0.2–1.2)
BUN: 7 mg/dL (ref 6–23)
CALCIUM: 9.2 mg/dL (ref 8.4–10.5)
CO2: 27 meq/L (ref 19–32)
Chloride: 105 mEq/L (ref 96–112)
Creat: 0.74 mg/dL (ref 0.50–1.10)
GLUCOSE: 75 mg/dL (ref 70–99)
POTASSIUM: 4 meq/L (ref 3.5–5.3)
Sodium: 141 mEq/L (ref 135–145)
TOTAL PROTEIN: 6.8 g/dL (ref 6.0–8.3)

## 2014-06-02 LAB — LIPID PANEL
Cholesterol: 175 mg/dL (ref 0–200)
HDL: 54 mg/dL (ref >39)
LDL Cholesterol: 96 mg/dL (ref 0–99)
TRIGLYCERIDES: 127 mg/dL (ref <150)
Total CHOL/HDL Ratio: 3.2 Ratio
VLDL: 25 mg/dL (ref 0–40)

## 2014-06-02 LAB — HEMOGLOBIN A1C
Hgb A1c MFr Bld: 4.8 % (ref <5.7)
MEAN PLASMA GLUCOSE: 91 mg/dL (ref <117)

## 2014-06-02 MED ORDER — BUPROPION HCL ER (XL) 300 MG PO TB24: 300.0000 mg | ORAL_TABLET | Freq: Every day | ORAL | Status: DC

## 2014-06-02 MED ORDER — LISDEXAMFETAMINE DIMESYLATE 20 MG PO CAPS: 20.0000 mg | ORAL_CAPSULE | Freq: Every day | ORAL | Status: DC

## 2014-06-02 MED ORDER — SERTRALINE HCL 25 MG PO TABS: 25.0000 mg | ORAL_TABLET | Freq: Every day | ORAL | Status: DC

## 2014-06-02 MED ORDER — LISDEXAMFETAMINE DIMESYLATE 40 MG PO CAPS: 40.0000 mg | ORAL_CAPSULE | ORAL | Status: DC

## 2014-06-02 MED ORDER — CLONAZEPAM 0.5 MG PO TABS: 0.5000 mg | ORAL_TABLET | Freq: Two times a day (BID) | ORAL | Status: DC | PRN

## 2014-06-02 NOTE — Progress Notes (Signed)
[facility] [phone] Progress Note   Dawn Moreno [id number] 34 y.o.  06/02/2014 4:29 PM  Chief Complaint:  I resigned my job.  I was very stressed.  I'm looking for a new job.    History of Present Illness:  Dawn Moreno came for her followup appointment.  She reported that she resigned her job because her boss was giving a very hard time.  She was under a lot of stress and she decided to quit the job because she: No longer take the stress.  She mentioned before making that decision she had discussed with her husband and she had good support from him.  She is not looking for a new job and she is hoping to find one.  Since she designed she is feeling much calmer and less anxious.  She is sleeping better.  Her energy level is good.  I asked if Vyvanse were making her more anxious, she replied that she had tried stopping Vyvanse for few days however her anxiety does not get better but her attention and concentration get worse.  She resumed her Vyvanse and she felt much improvement in her attention and concentration.  Patient denies any irritability or any anger.  She denies any crying spells.  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  Patient denies drinking or using any illegal substances.  She denies any active or passive suicidal thoughts or homicidal thought.  Her appetite is okay.  Her vitals are stable.  She able to lose some weight from the past.  She is taking Klonopin , Zoloft , Wellbutrin and Vyvanse.  Patient denies any side effects of medication.  She had blood work which is normal.  Suicidal Ideation: No Plan Formed: No Patient has means to carry out plan: No  Homicidal Ideation: No Plan Formed: No Patient has means to carry out plan: No  Past Psychiatric History/Hospitalization(s) She endorsed history of depression since 2007 when she started job .  In the beginning she was given Xanax by her primary care physician.  In  2010 she saw psychiatrist who started her on Zyprexa, Prozac, Wellbutrin, Xanax and Adderall.  In 2012 she was given Abilify by different psychiatrist and she also given Vyvanse and Adderall.  The patient has significant withdrawal symptoms when she ran out from Adderall.  She admitted history of irritability, impulsive behavior when she was on Adderall.  She remembers Zyprexa Prozac and Lamictal was making her more tired and her psychiatrist was giving Adderall to help in energy level.  In 2015 she had intensive outpatient program and partial hospitalization treatment at [facility] in [place].  Her last prescription was Wellbutrin 450 mg , Klonopin 0.5 mg twice a day and Adderall 20 mg twice a day.  Patient denies any history of suicidal attempt , mania, psychosis , hallucination or any inpatient psychiatric treatment.   Anxiety: Yes Bipolar Disorder: Patient has diagnosed bipolar disorder by 2 previous psychiatrists however she denies any history of mania and psychosis. Depression: Yes Mania: No Psychosis: No Schizophrenia: No Personality Disorder: No Hospitalization for psychiatric illness: No History of Electroconvulsive Shock Therapy: No Prior Suicide Attempts: No  Medical History; Patient is prediabetic.  She does not have any active medical illness.  She has no primary care physician.  Review of Systems  Constitutional: Negative.   Skin: Negative for itching and rash.  Neurological: Negative.   Psychiatric/Behavioral: Negative for suicidal ideas and hallucinations.    Psychiatric: Agitation: No  Hallucination: No Depressed Mood: No Insomnia: No Hypersomnia: No Altered Concentration: No Feels Worthless: No Grandiose Ideas: No Belief In Special Powers: No New/Increased Substance Abuse: No Compulsions: No  Neurologic: Headache: No Seizure: No Paresthesias: No   Musculoskeletal: Strength & Muscle Tone: within normal limits Gait & Station: unsteady Patient leans:  N/A   Outpatient Encounter Prescriptions as of 06/02/2014  Medication Sig  . buPROPion (WELLBUTRIN XL) 300 MG 24 hr tablet Take 1 tablet (300 mg total) by mouth daily.  . clonazePAM (KLONOPIN) 0.5 MG tablet Take 1 tablet (0.5 mg total) by mouth 2 (two) times daily as needed for anxiety.  Marland Kitchen L-Methylfolate-Algae (DEPLIN 15 PO) Take 15 mg by mouth.  . lisdexamfetamine (VYVANSE) 20 MG capsule Take 1 capsule (20 mg total) by mouth daily.  Marland Kitchen lisdexamfetamine (VYVANSE) 40 MG capsule Take 1 capsule (40 mg total) by mouth every morning.  . LO LOESTRIN FE 1 MG-10 MCG / 10 MCG tablet   . Multiple Vitamin (MULTIVITAMIN) capsule Take 1 capsule by mouth daily.  . sertraline (ZOLOFT) 25 MG tablet Take 1 tablet (25 mg total) by mouth daily.  . [DISCONTINUED] buPROPion (WELLBUTRIN XL) 300 MG 24 hr tablet Take 1 tablet (300 mg total) by mouth daily.  . [DISCONTINUED] clonazePAM (KLONOPIN) 0.5 MG tablet Take 1 tablet (0.5 mg total) by mouth 2 (two) times daily as needed for anxiety.  . [DISCONTINUED] lisdexamfetamine (VYVANSE) 20 MG capsule Take 1 capsule (20 mg total) by mouth daily.  . [DISCONTINUED] lisdexamfetamine (VYVANSE) 40 MG capsule Take 1 capsule (40 mg total) by mouth every morning.  . [DISCONTINUED] LOESTRIN 24 FE 1-20 MG-MCG tablet Take 1 tablet by mouth daily.  . [DISCONTINUED] MICROGESTIN FE 1/20 1-20 MG-MCG tablet   . [DISCONTINUED] sertraline (ZOLOFT) 25 MG tablet Take 1 tablet (25 mg total) by mouth daily.    Recent Results (from the past 2160 hour(s))  Hemoglobin A1c     Status: None   Collection Time: 06/01/14  2:07 PM  Result Value Ref Range   Hgb A1c MFr Bld 4.8 <5.7 %    Comment:                                                                        According to the ADA Clinical Practice Recommendations for 2011, when HbA1c is used as a screening test:     >=6.5%   Diagnostic of Diabetes Mellitus            (if abnormal result is confirmed)   5.7-6.4%   Increased risk of  developing Diabetes Mellitus   References:Diagnosis and Classification of Diabetes Mellitus,Diabetes YOVZ,8588,50(YDXAJ 1):S62-S69 and Standards of Medical Care in         Diabetes - 2011,Diabetes Care,2011,34 (Suppl 1):S11-S61.      Mean Plasma Glucose 91 <117 mg/dL  Lipid panel     Status: None   Collection Time: 06/01/14  2:07 PM  Result Value Ref Range   Cholesterol 175 0 - 200 mg/dL    Comment: ATP III Classification:       < 200        mg/dL        Desirable      200 - 239  mg/dL        Borderline High      >= 240        mg/dL        High      Triglycerides 127 <150 mg/dL   HDL 54 >39 mg/dL   Total CHOL/HDL Ratio 3.2 Ratio   VLDL 25 0 - 40 mg/dL   LDL Cholesterol 96 0 - 99 mg/dL    Comment:   Total Cholesterol/HDL Ratio:CHD Risk                        Coronary Heart Disease Risk Table                                        Men       Women          1/2 Average Risk              3.4        3.3              Average Risk              5.0        4.4           2X Average Risk              9.6        7.1           3X Average Risk             23.4       11.0 Use the calculated Patient Ratio above and the CHD Risk table  to determine the patient's CHD Risk. ATP III Classification (LDL):       < 100        mg/dL         Optimal      100 - 129     mg/dL         Near or Above Optimal      130 - 159     mg/dL         Borderline High      160 - 189     mg/dL         High       > 190        mg/dL         Very High     Comprehensive metabolic panel     Status: None   Collection Time: 06/01/14  2:07 PM  Result Value Ref Range   Sodium 141 135 - 145 mEq/L   Potassium 4.0 3.5 - 5.3 mEq/L   Chloride 105 96 - 112 mEq/L   CO2 27 19 - 32 mEq/L   Glucose, Bld 75 70 - 99 mg/dL   BUN 7 6 - 23 mg/dL   Creat 0.74 0.50 - 1.10 mg/dL   Total Bilirubin 0.5 0.2 - 1.2 mg/dL   Alkaline Phosphatase 65 39 - 117 U/L   AST 15 0 - 37 U/L   ALT 17 0 - 35 U/L   Total Protein 6.8 6.0 - 8.3 g/dL    Albumin 4.1 3.5 - 5.2 g/dL   Calcium 9.2 8.4 - 10.5 mg/dL  CBC with Differential     Status: None   Collection Time: 06/01/14  2:07 PM  Result Value Ref Range   WBC 7.5 4.0 - 10.5 K/uL   RBC 4.75 3.87 - 5.11 MIL/uL   Hemoglobin 14.6 12.0 - 15.0 g/dL   HCT 41.8 36.0 - 46.0 %   MCV 88.0 78.0 - 100.0 fL   MCH 30.7 26.0 - 34.0 pg   MCHC 34.9 30.0 - 36.0 g/dL   RDW 12.5 11.5 - 15.5 %   Platelets 301 150 - 400 K/uL   Neutrophils Relative % 59 43 - 77 %   Neutro Abs 4.4 1.7 - 7.7 K/uL   Lymphocytes Relative 33 12 - 46 %   Lymphs Abs 2.5 0.7 - 4.0 K/uL   Monocytes Relative 7 3 - 12 %   Monocytes Absolute 0.5 0.1 - 1.0 K/uL   Eosinophils Relative 1 0 - 5 %   Eosinophils Absolute 0.1 0.0 - 0.7 K/uL   Basophils Relative 0 0 - 1 %   Basophils Absolute 0.0 0.0 - 0.1 K/uL   Smear Review Criteria for review not met       Constitutional:  BP 118/70 mmHg  Pulse 93  Ht _0  (1.727 m)  Wt 144 lb 6.4 oz (65.499 kg)  BMI 21.96 kg/m2   Mental Status Examination;  Patient is well groomed and well dressed female who appears to be her stated age.  She is calm and cooperative.  She described her mood better and her affect is appropriate. She denies any auditory or visual hallucination.  She denies any active or passive suicidal thoughts or homicidal thoughts.  There were no paranoia, delusions or any obsessive thoughts.  Her attention and concentration is improved from the past.  Her psychomotor activity is normal.  There were no tremors or shakes.  Her fund of knowledge is good.  She is alert and oriented x3.  There were no flight of ideas or any loose association.  Her insight judgment and impulse control is good  Established Problem, Stable/Improving (1), Review of Psycho-Social Stressors (1), Review or order clinical lab tests (1), Review and summation of old records (2), Review of Last Therapy Session (1) and Review of Medication Regimen & Side Effects (2)   Assessment: Axis I: Mood  disorder NOS, rule out major depressive disorder recurrent, rule out ADHD  Axis II: Deferred  Axis III:  History reviewed. No pertinent past medical history.  Axis IV: Moderate   Plan:  I review blood work and discussed the results.  Reassurance given.  At this time she does not want to change her medication.  Reinforce psychological testing which has not completed yet.  She is scheduled to see Dr. Jefm Miles on 29th.  Discussed medication side effects and benefits.  I will continue Klonopin 0.5 mg twice a day, Wellbutrin XL 300 mg daily, Zoloft 25 mg daily, Vyvanse 40 mg in the morning and 20 mg in the afternoon.  I will see her again in 2 months. Recommended to call us back if she has any question, concern or if she feels worsening of the symptom.  Discussed benzodiazepine or stimulants withdrawal, tolerance and abuse.  Time spent 25 minutes.  More than 50% of the time spent in psychoeducation, counseling and coordination of care.  Discuss safety plan that anytime having active suicidal thoughts or homicidal thoughts then patient need to call 911 or go to the local emergency room.  Arla Boutwell T., MD 06/02/2014

## 2014-06-04 ENCOUNTER — Ambulatory Visit (INDEPENDENT_AMBULATORY_CARE_PROVIDER_SITE_OTHER): Payer: BLUE CROSS/BLUE SHIELD | Admitting: Psychology

## 2014-06-27 ENCOUNTER — Emergency Department (INDEPENDENT_AMBULATORY_CARE_PROVIDER_SITE_OTHER)
Admission: EM | Admit: 2014-06-27 | Discharge: 2014-06-27 | Disposition: A | Discharge status: Home / Self Care | Payer: BLUE CROSS/BLUE SHIELD | Source: Home / Self Care | Attending: Emergency Medicine | Admitting: Emergency Medicine

## 2014-06-27 ENCOUNTER — Encounter: Payer: Self-pay | Admitting: Emergency Medicine

## 2014-06-27 DIAGNOSIS — J209 Acute bronchitis, unspecified: Secondary | ICD-10-CM

## 2014-06-27 HISTORY — DX: Depression, unspecified: F32.A

## 2014-06-27 HISTORY — DX: Major depressive disorder, single episode, unspecified: F32.9

## 2014-06-27 HISTORY — DX: Anxiety disorder, unspecified: F41.9

## 2014-06-27 MED ORDER — AMOXICILLIN 500 MG PO CAPS: 500.0000 mg | ORAL_CAPSULE | Freq: Three times a day (TID) | ORAL | Status: DC

## 2014-06-27 NOTE — ED Notes (Signed)
Reports 2 week history of congestion, cough, hoarseness, fatigue; intermittent improvement. No recent fever.

## 2014-06-27 NOTE — ED Provider Notes (Signed)
CSN: [id number]     Arrival date & time 06/27/14  1605 History   First MD Initiated Contact with Patient 06/27/14 1644     Chief Complaint  Patient presents with  . Nasal Congestion   (Consider location/radiation/quality/duration/timing/severity/associated sxs/prior Treatment) Patient is a 34 y.o. female presenting with cough. No language interpreter was used.  Cough Cough characteristics:  Productive Sputum characteristics:  Nondescript Severity:  Moderate Duration:  2 weeks Timing:  Constant Progression:  Worsening Chronicity:  New Smoker: no   Context: sick contacts   Relieved by:  Nothing Worsened by:  Nothing tried Ineffective treatments:  None tried Associated symptoms: no shortness of breath and no sore throat   Risk factors: no recent infection     Past Medical History  Diagnosis Date  . Anxiety and depression    Past Surgical History  Procedure Laterality Date  . Wisdom tooth extraction     Family History  Problem Relation Age of Onset  . Depression Father    History  Substance Use Topics  . Smoking status: Never Smoker   . Smokeless tobacco: Not on file  . Alcohol Use: 0.0 oz/week    0 Standard drinks or equivalent per week   OB History    No data available     Review of Systems  HENT: Negative for sore throat.   Respiratory: Positive for cough. Negative for shortness of breath.   All other systems reviewed and are negative.   Allergies  Review of patient's allergies indicates no known allergies.  Home Medications   Prior to Admission medications   Medication Sig Start Date End Date Taking? Authorizing Provider  buPROPion (WELLBUTRIN XL) 300 MG 24 hr tablet Take 1 tablet (300 mg total) by mouth daily. 06/02/14   Cleotis Nipper, MD  clonazePAM (KLONOPIN) 0.5 MG tablet Take 1 tablet (0.5 mg total) by mouth 2 (two) times daily as needed for anxiety. 06/02/14   Cleotis Nipper, MD  L-Methylfolate-Algae (DEPLIN 15 PO) Take 15 mg by mouth.    Historical  Provider, MD  lisdexamfetamine (VYVANSE) 20 MG capsule Take 1 capsule (20 mg total) by mouth daily. 06/02/14   Cleotis Nipper, MD  lisdexamfetamine (VYVANSE) 40 MG capsule Take 1 capsule (40 mg total) by mouth every morning. 06/02/14   Cleotis Nipper, MD  LO LOESTRIN FE 1 MG-10 MCG / 10 MCG tablet  05/31/14   Historical Provider, MD  Multiple Vitamin (MULTIVITAMIN) capsule Take 1 capsule by mouth daily.    Historical Provider, MD  sertraline (ZOLOFT) 25 MG tablet Take 1 tablet (25 mg total) by mouth daily. 06/02/14   Cleotis Nipper, MD   BP 119/71 mmHg  Pulse 83  Temp(Src) 98.3 F (36.8 C) (Oral)  Resp 16  Ht  (1.727 m)  Wt 145 lb (65.772 kg)  BMI 22.05 kg/m2  SpO2 97%  LMP 06/13/2014 (Approximate) Physical Exam  Constitutional: She is oriented to person, place, and time. She appears well-developed and well-nourished.  HENT:  Head: Normocephalic and atraumatic.  Right Ear: External ear normal.  Eyes: EOM are normal. Pupils are equal, round, and reactive to light.  Neck: Normal range of motion.  Cardiovascular: Normal rate and normal heart sounds.   Pulmonary/Chest: Effort normal.  Abdominal: Soft. She exhibits no distension.  Musculoskeletal: Normal range of motion.  Neurological: She is alert and oriented to person, place, and time.  Skin: Skin is warm.  Psychiatric: She has a normal mood and affect.  Nursing  note and vitals reviewed.   ED Course  Procedures (including critical care time) Labs Review Labs Reviewed - No data to display  Imaging Review No results found.   MDM   1. Acute bronchitis, unspecified organism    amoxicillian avs Use back up birth control     Lonia SkinnerLeslie K Study ButteSofia, [place] JerseyPA-C 06/27/14 1654

## 2014-06-27 NOTE — Discharge Instructions (Signed)

## 2014-06-29 NOTE — Telephone Encounter (Signed)
Patient left a message requesting a refill of her Vyvanse.  Patient's next appointment 08/04/14

## 2014-06-30 ENCOUNTER — Telehealth (HOSPITAL_COMMUNITY): Payer: Self-pay

## 2014-06-30 ENCOUNTER — Other Ambulatory Visit (HOSPITAL_COMMUNITY): Payer: Self-pay | Admitting: Psychiatry

## 2014-06-30 DIAGNOSIS — F331 Major depressive disorder, recurrent, moderate: Secondary | ICD-10-CM

## 2014-06-30 DIAGNOSIS — F988 Other specified behavioral and emotional disorders with onset usually occurring in childhood and adolescence: Secondary | ICD-10-CM

## 2014-06-30 MED ORDER — LISDEXAMFETAMINE DIMESYLATE 40 MG PO CAPS: 40.0000 mg | ORAL_CAPSULE | ORAL | Status: DC

## 2014-06-30 MED ORDER — LISDEXAMFETAMINE DIMESYLATE 20 MG PO CAPS: 20.0000 mg | ORAL_CAPSULE | Freq: Every day | ORAL | Status: DC

## 2014-06-30 NOTE — Telephone Encounter (Signed)
06/30/14 3:23pm Patient came and pick-up rx script DL# [id number] [place].Marland Kitchen.Marguerite Olea/sh

## 2014-07-06 ENCOUNTER — Ambulatory Visit (HOSPITAL_COMMUNITY): Payer: Self-pay | Admitting: Psychology

## 2014-07-27 ENCOUNTER — Telehealth (HOSPITAL_COMMUNITY): Payer: Self-pay | Admitting: *Deleted

## 2014-07-27 ENCOUNTER — Other Ambulatory Visit (HOSPITAL_COMMUNITY): Payer: Self-pay | Admitting: Psychiatry

## 2014-07-27 DIAGNOSIS — F988 Other specified behavioral and emotional disorders with onset usually occurring in childhood and adolescence: Secondary | ICD-10-CM

## 2014-07-27 DIAGNOSIS — F331 Major depressive disorder, recurrent, moderate: Secondary | ICD-10-CM

## 2014-07-27 MED ORDER — LISDEXAMFETAMINE DIMESYLATE 20 MG PO CAPS: 20.0000 mg | ORAL_CAPSULE | Freq: Every day | ORAL | Status: DC

## 2014-07-27 MED ORDER — LISDEXAMFETAMINE DIMESYLATE 40 MG PO CAPS: 40.0000 mg | ORAL_CAPSULE | ORAL | Status: DC

## 2014-07-27 NOTE — Telephone Encounter (Signed)
We will provide her Vyvanse but no early refills.  We will discuss to have her psychological testing done when she comes on her appointment.

## 2014-07-27 NOTE — Telephone Encounter (Signed)
Dr. [name],   Patient called today requesting refill on Vyvanse.  Patient was last seen 06-02-14.  Patient was suppose to have testing done with Dr. Kieth Brightlyodenbough.  Dr. Kieth Brightlyodenbough did initial office visit 03-2014. Patient has no showed 3 appointments with Dr. Kieth Brightlyodenbough to do the comprehensive attention battery test. Last office visit on 06-02-2014 you stated patient must have test completed on 06-04-2014 and she no showed appointment.  Patient received refill on 06-30-2014 for Vyvanse.  Patient is due to follow up 08-04-14.  Patient has not made any future appointments with Dr. Kieth Brightlyodenbough.  When I spoke with patient she stated that she did mail the paper test back into Dr. Marvetta Gibbonsodenbough's office but has not done the test in office that Dr. Kieth Brightlyodenbough is over. Patient stated that she was sick, moving, and lost track of time. I advised patient I will have to send this information to you then it will be your decision if you want to refill Vyvanse. [place] [name] and they rescheduled patient for testing on 07-06-14 and she no showed.   *Note from Dr. Kieth Brightlyodenbough from office visit in November...  Psychiatric History:The patient does have a long history of back those with depression anxiety and is also been treated for attention deficit disorder for quite some time. She did have a time where she was overusing her Adderall in the self-medicating.

## 2014-07-28 ENCOUNTER — Telehealth (HOSPITAL_COMMUNITY): Payer: Self-pay

## 2014-07-29 ENCOUNTER — Telehealth (HOSPITAL_COMMUNITY): Payer: Self-pay

## 2014-07-29 NOTE — Telephone Encounter (Signed)
Patient picked up prescription on 6/21/303/24/16  [facility]  [id number]  dlo

## 2014-08-02 ENCOUNTER — Ambulatory Visit (HOSPITAL_COMMUNITY): Payer: Self-pay | Admitting: Psychiatry

## 2014-08-04 ENCOUNTER — Ambulatory Visit (HOSPITAL_COMMUNITY): Payer: Self-pay | Admitting: Psychiatry

## 2014-08-18 NOTE — Telephone Encounter (Signed)
I returned phone call.  She is complaining of hypersomnia, tired, fatigued and sleeping more than 12-13 hours a day.  She is also in the process of moving to a new location.  She is not sure what is causing hypersomnia.  She has not cyclical testing because of busy schedule.  I recommended if taking a stimulant and Wellbutrin still causing hypersomnia then she should get sleep studies.  She believe Vyvanse causing hypersomnia and she wanted to try few days off to see if her fatigue gets better.  She is no longer taking Klonopin because of excessive sedation.  I recommended if she wants when she can try stopping Vyvanse for a few days to see if fatigue get better and if he does not get better then she should consider sleep studies.

## 2014-08-23 ENCOUNTER — Telehealth (HOSPITAL_COMMUNITY): Payer: Self-pay

## 2014-08-24 NOTE — Telephone Encounter (Signed)
Met with Dr. Adele Schilder who requested to discontinue patient's Vyvanse 40 mg prescription but continue her 87m daily dosage until seen for next evaluation on 08/31/14.  Called patient to inform of Dr. AMarguerite Oleainstruction and patient agreed with plan.  Patient stated she had not taken the Vyvanse in several days and was not sure if had enough of the 226muntil appointment 08/31/14.  Patient agreed to call back if will run out prior to appointment.

## 2014-08-27 ENCOUNTER — Telehealth (HOSPITAL_COMMUNITY): Payer: Self-pay

## 2014-08-27 ENCOUNTER — Telehealth (HOSPITAL_COMMUNITY): Payer: Self-pay | Admitting: *Deleted

## 2014-08-27 NOTE — Telephone Encounter (Signed)
Telephone call from patient to fully explain why she is requesting a refill of her Vyvanse 20mg  capsules.  Ms. Dawn Moreno reported she would need a refill of 20mg  capsules on 08/30/14 as will be out that day even though she does return to see Dr. [name] on 08/31/14.  Patient reported even tough instructed to stop the 40mg  and just stick with the 20mg , several times she took a 20mg  in the morning and another one later in the day.  Agreed to pass this message on to Dr. [name] and requested she call back on 08/30/14 to see if he would refill the 20mg  then or would need to wait until appointment 08/31/14.  Patient stated she is just taking 20mg  once a day now as was discussed on 08/24/14.

## 2014-08-27 NOTE — Telephone Encounter (Signed)
Telephone message from patient received stating she had received a message today from our office but could not listen to the message.  Patient requested a call back to question what was needed.  Reviewed patient's record and left her a message that Guillermina City[place]avia Richard[place] [facility] [place] [facility] was attempting to reach patient to reschedule her psychological testing with Dr. Kieth Brightlyodenbough from April 27th to April 29th.  Left the [facility] to [facility] and requested patient call them to reschedule her psychological testing.

## 2014-08-27 NOTE — Telephone Encounter (Signed)
Called pt resch appt from April 27th to April 29th. lmtcb and number provided.

## 2014-08-30 ENCOUNTER — Telehealth (HOSPITAL_COMMUNITY): Payer: Self-pay

## 2014-08-30 NOTE — Telephone Encounter (Signed)
Phone call return and left a message to call us back.

## 2014-08-31 ENCOUNTER — Encounter (HOSPITAL_COMMUNITY): Payer: Self-pay | Admitting: Psychiatry

## 2014-08-31 ENCOUNTER — Ambulatory Visit (INDEPENDENT_AMBULATORY_CARE_PROVIDER_SITE_OTHER): Payer: BLUE CROSS/BLUE SHIELD | Admitting: Psychiatry

## 2014-08-31 VITALS — BP 123/80 | HR 64 | Ht 68.0 in | Wt 143.0 lb

## 2014-08-31 DIAGNOSIS — F39 Unspecified mood [affective] disorder: Secondary | ICD-10-CM

## 2014-08-31 DIAGNOSIS — F331 Major depressive disorder, recurrent, moderate: Secondary | ICD-10-CM

## 2014-08-31 MED ORDER — BUPROPION HCL ER (XL) 300 MG PO TB24
300.0000 mg | ORAL_TABLET | Freq: Every day | ORAL | Status: DC
Start: 2014-08-31 — End: 2014-10-07

## 2014-08-31 MED ORDER — LISDEXAMFETAMINE DIMESYLATE 20 MG PO CAPS: 20.0000 mg | ORAL_CAPSULE | Freq: Every day | ORAL | Status: DC

## 2014-08-31 MED ORDER — DEPLIN 15 15-90.314 MG PO CAPS: 15.0000 mg | ORAL_CAPSULE | Freq: Every day | ORAL | Status: DC

## 2014-08-31 MED ORDER — SERTRALINE HCL 25 MG PO TABS: 25.0000 mg | ORAL_TABLET | Freq: Every day | ORAL | Status: DC

## 2014-08-31 MED ORDER — CLONAZEPAM 0.5 MG PO TABS: 0.5000 mg | ORAL_TABLET | Freq: Every day | ORAL | Status: DC

## 2014-08-31 NOTE — Progress Notes (Signed)
[facility] [id number] Progress Note   Dawn Moreno [id number] 34 y.o.  08/31/2014 4:56 PM  Chief Complaint:  I cut down my Vyvanse because it was making me too tired.    History of Present Illness:  Dawn Moreno came for her followup appointment.  She had called office a few times complaining of feeling very tired with increased dose of Vyvanse.  She was told to cut down the dosage and now she is taking 20 mg .  Though her attention and concentration got first but her energy level is better.  She is not working and at this time she does not want a higher dose of stimulant.  She reported her anxiety and depression is better.  She sleeping some time to much and she is not happy about it.  She reported improvement in her energy level since she cut down the Vyvanse.  She had also tried stopping the Vyvanse but then her attention guard really worst and she has difficulty doing multitasking.  Patient is scheduled to see Dr. Shelva Majestic on Friday for psychological testing.  She is looking for a part-time job .  She is taking Klonopin only as needed and overall she is feeling better.  She denies any paranoia, hallucination, irritability or any anger.  Patient denies drinking or using any illegal substances.  Her appetite is okay.  Her vitals are stable.  Suicidal Ideation: No Plan Formed: No Patient has means to carry out plan: No  Homicidal Ideation: No Plan Formed: No Patient has means to carry out plan: No  Past Psychiatric History/Hospitalization(s) She endorsed history of depression since 2007 when she started job .  In the beginning she was given Xanax by her primary care physician.  In 2010 she saw psychiatrist who started her on Zyprexa, Prozac, Wellbutrin, Xanax and Adderall.  In 2012 she was given Abilify by different psychiatrist and she also given Vyvanse and Adderall.  The patient has significant withdrawal symptoms when she ran out from Adderall.  She admitted history of irritability,  impulsive behavior when she was on Adderall.  She remembers Zyprexa Prozac and Lamictal was making her more tired and her psychiatrist was giving Adderall to help in energy level.  In 2015 she had intensive outpatient program and partial hospitalization treatment at [facility] in [place].  Her last prescription was Wellbutrin 450 mg , Klonopin 0.5 mg twice a day and Adderall 20 mg twice a day.  Patient denies any history of suicidal attempt , mania, psychosis , hallucination or any inpatient psychiatric treatment.   Anxiety: Yes Bipolar Disorder: Patient has diagnosed bipolar disorder by 2 previous psychiatrists however she denies any history of mania and psychosis. Depression: Yes Mania: No Psychosis: No Schizophrenia: No Personality Disorder: No Hospitalization for psychiatric illness: No History of Electroconvulsive Shock Therapy: No Prior Suicide Attempts: No  Medical History; Patient is prediabetic.  She does not have any active medical illness.  She has no primary care physician.  Review of Systems  Constitutional: Positive for malaise/fatigue.  HENT: Negative.   Musculoskeletal: Negative.   Skin: Negative.   Neurological: Negative for tremors.  Psychiatric/Behavioral: Negative for suicidal ideas, hallucinations and substance abuse.    Psychiatric: Agitation: No Hallucination: No Depressed Mood: No Insomnia: No Hypersomnia: No Altered Concentration: No Feels Worthless: No Grandiose Ideas: No Belief In Special Powers: No New/Increased Substance Abuse: No Compulsions: No  Neurologic: Headache: No Seizure: No Paresthesias: No   Musculoskeletal: Strength & Muscle Tone: within normal limits Gait & Station:  unsteady Patient leans: N/A   Outpatient Encounter Prescriptions as of 08/31/2014  Medication Sig  . buPROPion (WELLBUTRIN XL) 300 MG 24 hr tablet Take 1 tablet (300 mg total) by mouth daily.  . clonazePAM (KLONOPIN) 0.5 MG tablet Take 1 tablet (0.5 mg  total) by mouth daily.  Marland Kitchen L-Methylfolate-Algae (DEPLIN 15) 15-90.314 MG CAPS Take 15 mg by mouth daily.  Marland Kitchen lisdexamfetamine (VYVANSE) 20 MG capsule Take 1 capsule (20 mg total) by mouth daily.  . LO LOESTRIN FE 1 MG-10 MCG / 10 MCG tablet   . Multiple Vitamin (MULTIVITAMIN) capsule Take 1 capsule by mouth daily.  . sertraline (ZOLOFT) 25 MG tablet Take 1 tablet (25 mg total) by mouth daily.  . [DISCONTINUED] amoxicillin (AMOXIL) 500 MG capsule Take 1 capsule (500 mg total) by mouth 3 (three) times daily.  . [DISCONTINUED] buPROPion (WELLBUTRIN XL) 300 MG 24 hr tablet Take 1 tablet (300 mg total) by mouth daily.  . [DISCONTINUED] clonazePAM (KLONOPIN) 0.5 MG tablet Take 1 tablet (0.5 mg total) by mouth 2 (two) times daily as needed for anxiety.  . [DISCONTINUED] L-Methylfolate-Algae (DEPLIN 15 PO) Take 15 mg by mouth.  . [DISCONTINUED] lisdexamfetamine (VYVANSE) 20 MG capsule Take 1 capsule (20 mg total) by mouth daily.  . [DISCONTINUED] lisdexamfetamine (VYVANSE) 20 MG capsule Take 1 capsule (20 mg total) by mouth daily.  . [DISCONTINUED] sertraline (ZOLOFT) 25 MG tablet Take 1 tablet (25 mg total) by mouth daily.    No results found for this or any previous visit (from the past 2160 hour(s)).    Constitutional:  BP 123/80 mmHg  Pulse 64  Ht  (1.727 m)  Wt 143 lb (64.864 kg)  BMI 21.75 kg/m2   Mental Status Examination;  Patient is well groomed and well dressed female who appears to be her stated age.  She is calm and cooperative.  She described her mood  tired and her affect is appropriate.  She denies any auditory or visual hallucination.  She denies any active or passive suicidal thoughts or homicidal thoughts.  There were no paranoia, delusions or any obsessive thoughts.  Her attention and concentration is  fair. Her psychomotor activity is normal.  There were no tremors or shakes.  Her fund of knowledge is good.  She is alert and oriented x3.  There were no flight of ideas or  any loose association.  Her insight judgment and impulse control is good  Established Problem, Stable/Improving (1), Review of Psycho-Social Stressors (1), Review and summation of old records (2), New Problem, with no additional work-up planned (3), Review of Last Therapy Session (1), Review of Medication Regimen & Side Effects (2) and Review of New Medication or Change in Dosage (2)   Assessment: Axis I: Mood disorder NOS, rule out major depressive disorder recurrent, rule out ADHD  Axis II: Deferred  Axis III:  Past Medical History  Diagnosis Date  . Anxiety and depression     Axis IV: Moderate   Plan:  I review her symptoms, current medication and her records.  She has cut down her Klonopin and only taking when necessary for severe anxiety.  She had cut down her Vyvanse and only taking 20 mg because higher dose causing fatigue.  At this time I will continue Wellbutrin XL 300 mg daily, Zoloft 25 mg daily and Vyvanse 20 mg daily.  Discussed medication side effects and benefits.  She is scheduled to see Dr. Shelva Majestic for psychological testing.  I encouraged to keep the appointment.  She had missed appointment in the past.  She also need a 90 day supply for Deplin.  Discussed benzodiazepine dependence and withdrawals.  Recommended to call us back if she has any question or any concern.  We will consider adjusting the dose of stimulant if her attention and focus continues to worst.  Follow-up in 2 months. Time spent 25 minutes.  More than 50% of the time spent in psychoeducation, counseling and coordination of care.  Discuss safety plan that anytime having active suicidal thoughts or homicidal thoughts then patient need to call 911 or go to the local emergency room.  Judson Tsan T., MD 08/31/2014

## 2014-09-01 ENCOUNTER — Ambulatory Visit (HOSPITAL_COMMUNITY): Payer: Self-pay | Admitting: Psychology

## 2014-09-03 ENCOUNTER — Ambulatory Visit (HOSPITAL_COMMUNITY): Payer: Self-pay | Admitting: Psychology

## 2014-09-03 ENCOUNTER — Telehealth (HOSPITAL_COMMUNITY): Payer: Self-pay | Admitting: *Deleted

## 2014-09-10 ENCOUNTER — Telehealth (HOSPITAL_COMMUNITY): Payer: Self-pay

## 2014-09-10 NOTE — Telephone Encounter (Signed)
Telephone call with patient after discussing patient's left message for this nurse about being too tired and still problems focusing with Vyvanse.  Discussed patient's history with Dr. [name] who stated concern patient had tried higher and lower dosages now of Vyvanse with limited effectiveness and problems with Adderall by history so now may benefit more by following up with her primary care provider or a neurologist consult as there may be something else going on.  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  Patient requests to speak to Dr. [name] about options but agrees may need to try other medical follow ups too.

## 2014-09-10 NOTE — Telephone Encounter (Signed)
Medication management - message received from patient's husband stating patient was stopping her Vyvanse due to no energy and could "not get out of bed".  Patient's husband reported patient requests a call back from Dr. [name] to discuss options/concerns. Next appointment set for 10/12/14 and was last seen on 08/31/14.

## 2014-09-10 NOTE — Telephone Encounter (Signed)
Medication management - Patient would still like to discuss medication options - has stopped Vyvanse and says already can tell she is not as tired.  Agrees to referrals to follow up with PCP or Neurology but would like to discuss and med options.  Informed patient Dr. Lolly Moreno had left for the day as she was fine with this and just wanted him to know [facility].  Requests call back in the coming week when he returns to discuss options and will follow up with PCP and/or neurologist if Dr. Lolly Moreno feels needed once they also discuss other medication options.  Ms. Dawn Moreno reports Adderall was always effective for helping her focus and not making her tired but cannot take due to addiction concerns.  Reports Vyvanse always makes her tired and would like to see if there are any other options similar to Adderall as a trial. Patient will call back on Monday 09/13/14 if has not heard back from Dr. Lolly Moreno to follow up.  Patient stable at this time with no other concerns and appreciative of help.

## 2014-09-13 ENCOUNTER — Telehealth (HOSPITAL_COMMUNITY): Payer: Self-pay

## 2014-09-13 DIAGNOSIS — F988 Other specified behavioral and emotional disorders with onset usually occurring in childhood and adolescence: Secondary | ICD-10-CM

## 2014-09-13 NOTE — Telephone Encounter (Signed)
Telephone message left with patient after discussing her concerns about Vyvanse and not being able to return to taking Adderall, that Dr. [name] was suggesting her try Ritalin 10mg  one each morning.  Requested patient call this nurse back to discuss further and informed we would not prepare the order until we discussed the start of Ritalin with her first.  Requested patient call back when available.

## 2014-09-14 MED ORDER — METHYLPHENIDATE HCL 10 MG PO TABS: 10.0000 mg | ORAL_TABLET | Freq: Every morning | ORAL | Status: DC

## 2014-09-14 NOTE — Telephone Encounter (Signed)
Telephone message received back from patient stating she would like to try the Ritalin 10mg , one each morning.  Patient requested to be notified when prescription is ready for pick up.

## 2014-09-14 NOTE — Telephone Encounter (Signed)
Met with Dr. Adele Schilder who gave the verbal order to start patient on Ritalin 70m, one each morning, #30 with no refills and to discontinue patient's Vyvanse.  Called patient to inform her order for Ritalin 171mwas prepared for pick up.  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  Requested patient take only as directed and would not be filled early.  Patient agreed to pick up new order today.

## 2014-09-23 ENCOUNTER — Telehealth (HOSPITAL_COMMUNITY): Payer: Self-pay

## 2014-09-23 NOTE — Telephone Encounter (Signed)
Telephone call to follow up with patient who left a message stating she would like a call back to discuss her new Ritalin medication.  Patient stated at first the medication was "working great".  States it wares off during the day and does not affect her sleep as she reports much improvement in this area.  Patient reported as she had gotten use to the new 10mg  dosage it is starting to not be as effective and only lasts about 2 and 1/2 to 3 hours.  Reports recently it is not quite as effective during that time either but likes the medication and questions if she would need a higher dosage or second dosage later in the day?  Agreed to send information to Dr. [name] and will follow up with patient after discussion with him. Patient returns for next evaluation on 11/01/14.

## 2014-09-24 ENCOUNTER — Other Ambulatory Visit (HOSPITAL_COMMUNITY): Payer: Self-pay | Admitting: Psychiatry

## 2014-09-24 NOTE — Telephone Encounter (Signed)
She needs to be seen earlier than 11/01/14 for adjustment of dosage. Schedule appt if she wants dose to be increased.

## 2014-09-24 NOTE — Telephone Encounter (Signed)
Left patient a message if she wanted to discuss any increases in Ritalin medication she would need to schedule and earlier appointment to come in and be seen or could discuss further at appointment on 11/01/14.

## 2014-10-05 ENCOUNTER — Other Ambulatory Visit (HOSPITAL_COMMUNITY): Payer: Self-pay | Admitting: Psychiatry

## 2014-10-07 ENCOUNTER — Ambulatory Visit (INDEPENDENT_AMBULATORY_CARE_PROVIDER_SITE_OTHER): Payer: BLUE CROSS/BLUE SHIELD | Admitting: Psychiatry

## 2014-10-07 ENCOUNTER — Encounter (HOSPITAL_COMMUNITY): Payer: Self-pay | Admitting: Psychiatry

## 2014-10-07 VITALS — BP 132/89 | HR 75 | Ht 68.0 in | Wt 144.0 lb

## 2014-10-07 DIAGNOSIS — F39 Unspecified mood [affective] disorder: Secondary | ICD-10-CM

## 2014-10-07 DIAGNOSIS — F331 Major depressive disorder, recurrent, moderate: Secondary | ICD-10-CM

## 2014-10-07 DIAGNOSIS — F988 Other specified behavioral and emotional disorders with onset usually occurring in childhood and adolescence: Secondary | ICD-10-CM

## 2014-10-07 MED ORDER — BUPROPION HCL ER (XL) 150 MG PO TB24: 150.0000 mg | ORAL_TABLET | Freq: Every day | ORAL | Status: AC

## 2014-10-07 MED ORDER — METHYLPHENIDATE HCL 10 MG PO TABS: 10.0000 mg | ORAL_TABLET | Freq: Every morning | ORAL | Status: DC

## 2014-10-07 MED ORDER — CLONAZEPAM 0.5 MG PO TABS
0.5000 mg | ORAL_TABLET | Freq: Every day | ORAL | Status: AC
Start: 2014-10-07 — End: ?

## 2014-10-07 MED ORDER — LAMOTRIGINE 25 MG PO TABS: ORAL_TABLET | ORAL | Status: DC

## 2014-10-07 NOTE — Progress Notes (Signed)
[facility] [id number] Progress Note   Dawn Moreno [id number] 34 y.o.  10/07/2014 4:09 PM  Chief Complaint:    I like Ritalin but sometime it makes me more nervous and anxious.  I'm taking Klonopin.      History of Present Illness:  Dawn Moreno came for Dawn Moreno followup appointment.   Dawn Moreno had call a few times to discuss Dawn Moreno medication.  Dawn Moreno is no longer taking Vyvanse because Dawn Moreno felt it was causing fatigue.  We started Dawn Moreno on low-dose Ritalin.  Dawn Moreno has seen improvement in Dawn Moreno energy level but Dawn Moreno also endorses anxiety and poor sleep.  Dawn Moreno admitted racing thoughts, nervousness at night.  Dawn Moreno is a still looking for a job.  Dawn Moreno admitted this is the biggest stressor in Dawn Moreno life.  However Dawn Moreno has a good supportive husband.  Dawn Moreno has unable to see Dr. Shelva Majestic to finish psychological testing but scheduled to see him on 20th.  Dawn Moreno has no tremors, shakes but admitted some time very emotional and tearful.  Dawn Moreno attention and focus is better with the Ritalin.  Patient denies drinking or using any illegal substances.  Dawn Moreno still taking Deplin and Dawn Moreno has no concern.  Dawn Moreno appetite is okay.  Dawn Moreno vitals are stable.  Suicidal Ideation: No Plan Formed: No Patient has means to carry out plan: No  Homicidal Ideation: No Plan Formed: No Patient has means to carry out plan: No  Past Psychiatric History/Hospitalization(s) Dawn Moreno endorsed history of depression since 2007 when Dawn Moreno started job .  In the beginning Dawn Moreno was given Xanax by Dawn Moreno primary care physician.  In 2010 Dawn Moreno saw psychiatrist who started Dawn Moreno on Zyprexa, Prozac, Wellbutrin, Xanax and Adderall.  In 2012 Dawn Moreno was given Abilify by different psychiatrist and Dawn Moreno also given Vyvanse and Adderall.  The patient has significant withdrawal symptoms when Dawn Moreno ran out from Adderall.  Dawn Moreno admitted history of irritability, impulsive behavior when Dawn Moreno was on Adderall.  Dawn Moreno remembers Zyprexa Prozac and Lamictal was making Dawn Moreno more tired and Dawn Moreno psychiatrist was giving  Adderall to help in energy level.  In 2015 Dawn Moreno had intensive outpatient program and partial hospitalization treatment at [facility] in [place].  Dawn Moreno last prescription was Wellbutrin 450 mg , Klonopin 0.5 mg twice a day and Adderall 20 mg twice a day.  Patient denies any history of suicidal attempt , mania, psychosis , hallucination or any inpatient psychiatric treatment.   Anxiety: Yes Bipolar Disorder: Patient has diagnosed bipolar disorder by 2 previous psychiatrists however Dawn Moreno denies any history of mania and psychosis. Depression: Yes Mania: No Psychosis: No Schizophrenia: No Personality Disorder: No Hospitalization for psychiatric illness: No History of Electroconvulsive Shock Therapy: No Prior Suicide Attempts: No  Medical History; Patient is prediabetic.  Dawn Moreno does not have any active medical illness.  Dawn Moreno has no primary care physician.  Review of Systems  Constitutional: Negative for malaise/fatigue.  Cardiovascular: Negative.   Musculoskeletal: Negative.   Skin: Negative for itching and rash.  Neurological: Negative for dizziness, tingling, tremors and headaches.  Psychiatric/Behavioral: Positive for suicidal ideas. Negative for memory loss. The patient is nervous/anxious and has insomnia.     Psychiatric: Agitation: No Hallucination: No Depressed Mood: No Insomnia: Yes Hypersomnia: No Altered Concentration: No Feels Worthless: No Grandiose Ideas: No Belief In Special Powers: No New/Increased Substance Abuse: No Compulsions: No  Neurologic: Headache: No Seizure: No Paresthesias: No   Musculoskeletal: Strength & Muscle Tone: within normal limits Gait & Station: unsteady Patient leans: N/A   Outpatient Encounter Prescriptions  as of 10/07/2014  Medication Sig  . buPROPion (WELLBUTRIN XL) 150 MG 24 hr tablet Take 1 tablet (150 mg total) by mouth daily.  . clonazePAM (KLONOPIN) 0.5 MG tablet Take 1 tablet (0.5 mg total) by mouth daily.  Marland Kitchen.  L-Methylfolate-Algae (DEPLIN 15) 15-90.314 MG CAPS Take 15 mg by mouth daily.  Marland Kitchen. lamoTRIgine (LAMICTAL) 25 MG tablet Take 1 tab daily for 1 week and than 2 tab daily  . LO LOESTRIN FE 1 MG-10 MCG / 10 MCG tablet   . methylphenidate (RITALIN) 10 MG tablet Take 1 tablet (10 mg total) by mouth every morning.  . Multiple Vitamin (MULTIVITAMIN) capsule Take 1 capsule by mouth daily.  . sertraline (ZOLOFT) 25 MG tablet Take 1 tablet (25 mg total) by mouth daily.  . [DISCONTINUED] buPROPion (WELLBUTRIN XL) 300 MG 24 hr tablet Take 1 tablet (300 mg total) by mouth daily.  . [DISCONTINUED] clonazePAM (KLONOPIN) 0.5 MG tablet Take 1 tablet (0.5 mg total) by mouth daily.  . [DISCONTINUED] methylphenidate (RITALIN) 10 MG tablet Take 1 tablet (10 mg total) by mouth every morning.   No facility-administered encounter medications on file as of 10/07/2014.    No results found for this or any previous visit (from the past 2160 hour(s)).    Constitutional:  BP 132/89 mmHg  Pulse 75  Ht 5\' 8"  (1.727 m)  Wt 144 lb (65.318 kg)  BMI 21.90 kg/m2   Mental Status Examination;  Patient is well groomed and well dressed female who appears to be Dawn Moreno stated age.  Dawn Moreno is  anxious but cooperative.  At times Dawn Moreno is tearful and Dawn Moreno is talking about Dawn Moreno anxiety symptoms.  Dawn Moreno described Dawn Moreno mood nervous and anxious and Dawn Moreno affect is constricted.  Dawn Moreno denies any auditory or visual hallucination.  Dawn Moreno denies any active or passive suicidal thoughts or homicidal thoughts.  There were no paranoia, delusions or any obsessive thoughts.  Dawn Moreno attention and concentration is  fair. Dawn Moreno psychomotor activity is normal.  There were no tremors or shakes.  Dawn Moreno fund of knowledge is good.  Dawn Moreno is alert and oriented x3.  There were no flight of ideas or any loose association.  Dawn Moreno insight judgment and impulse control is good  Established Problem, Stable/Improving (1), Review of Psycho-Social Stressors (1), Review and summation of old records  (2), Review of Last Therapy Session (1), Review of Medication Regimen & Side Effects (2) and Review of New Medication or Change in Dosage (2)   Assessment: Axis I: Mood disorder NOS, rule out major depressive disorder recurrent, rule out ADHD  Axis II: Deferred  Axis III:  Past Medical History  Diagnosis Date  . Anxiety and depression     Plan:  I review Dawn Moreno symptoms, current medication and Dawn Moreno records.   Dawn Moreno likes Ritalin which is helping Dawn Moreno focus and attention but Dawn Moreno also feeling more nervous and anxious.  I review Dawn Moreno records in the past Dawn Moreno had tried Vyvanse and Adderall which causes side effects.  I would defer any further increase in Ritalin.  Dawn Moreno remembered taking Lamictal but do not remember the effects.  I recommended to cut down Dawn Moreno Wellbutrin to 150 only and try Lamictal low dose to help Dawn Moreno mood and anxiety symptoms.  Dawn Moreno is taking Klonopin and I will defer increasing the dose due to the fact that Dawn Moreno has been tired and fatigued with psycho topic medication.  I also encouraged to see Dr. Shelva Majesticodenbaugh on Dawn Moreno schedule day to finish psychological testing.  Discussed medication side effects and benefits.  Continue Deplin and Klonopin at present dose.  Recommended to call us back if Dawn Moreno has any question or any concern.  Discuss safety concern that anytime having active suicidal thoughts or homicidal thought that Dawn Moreno need to call 911 or go to the local emergency room.  I will see Dawn Moreno again in 4 weeks.  Wayman Hoard T., MD 10/07/2014

## 2014-10-12 ENCOUNTER — Telehealth (HOSPITAL_COMMUNITY): Payer: Self-pay

## 2014-10-13 NOTE — Telephone Encounter (Signed)
Telephone call with patient to discuss Dr. Sheela StackArfeen's advice of need to continue with current Ritalin medication and not to switch to Adzenys extended release amphetamine.  Patient stated her Ritalin at low dosage "just is not working for me" and states she is trying to work and needs something to last longer.  Patient stated she is set to refill her Ritalin on 10/15/14 but just does not want to continue it and would like for Dr. [name] to consider change as thinks the extended release Adzenys will last longer and be more effective.  Patient is not reschedule to return until 11/10/14 and would like Dr. [name] to call to discuss options to change medications as again states she does not think Ritalin is working as she would like and wants to try something different. Discussed an earlier appointment but none currently open and patient states she has to pay $50 every time she comes in.  Agreed to request Dr. [name] call back to discuss further.

## 2014-10-13 NOTE — Telephone Encounter (Signed)
Met with Dr. Adele Schilder to discuss patient's requested information on Adezyns medication.  Left patient a message to call this nurse back to discuss as Dr. Adele Schilder does not think would be beneficial to change Ritalin as Adezyns medication is an amphetamine much like Ritalin.  Will await patient call back to discuss.

## 2014-10-15 NOTE — Telephone Encounter (Signed)
Telephone call back with patient after she left a message this evening tying to reach Dr. Lolly Mustache after he left a message for her.  Informed he had already left for the day to follow up on Monday 10/18/14.

## 2014-10-15 NOTE — Telephone Encounter (Signed)
I returned her phone call and left a message to call us back

## 2014-10-18 ENCOUNTER — Telehealth (HOSPITAL_COMMUNITY): Payer: Self-pay

## 2014-10-18 NOTE — Telephone Encounter (Signed)
I called patient again and left a message.

## 2014-10-18 NOTE — Telephone Encounter (Signed)
Medication management - Patient's husband called and requested a call back from Dr. Lolly Mustache to discuss patient.  No consent observed in record but Mr. Magstadt stated on message he would like to give information as patient will now not call back.

## 2014-10-18 NOTE — Telephone Encounter (Signed)
I returned patient's husband call.  We do not have any consent to discuss patient information however patient husband mentioned that patient has been more anxious and belief cutting down the Klonopin is not helping her.  I tried to call patient earlier at home .  I recommended her husband to come with the patient on her next appointment to further discuss patient's condition.  He will also get patient's consent if patient husband like to communicate in the future.

## 2014-10-25 ENCOUNTER — Ambulatory Visit (HOSPITAL_COMMUNITY): Payer: Self-pay | Admitting: Psychology

## 2014-11-01 ENCOUNTER — Ambulatory Visit (HOSPITAL_COMMUNITY): Payer: Self-pay | Admitting: Psychiatry

## 2014-11-10 ENCOUNTER — Ambulatory Visit (HOSPITAL_COMMUNITY): Payer: Self-pay | Admitting: Psychiatry

## 2015-01-05 ENCOUNTER — Ambulatory Visit: Payer: Self-pay | Admitting: Osteopathic Medicine

## 2015-01-11 ENCOUNTER — Encounter: Payer: Self-pay | Admitting: Osteopathic Medicine

## 2015-01-11 ENCOUNTER — Ambulatory Visit (INDEPENDENT_AMBULATORY_CARE_PROVIDER_SITE_OTHER): Payer: BLUE CROSS/BLUE SHIELD | Admitting: Osteopathic Medicine

## 2015-01-11 VITALS — BP 114/77 | HR 82 | Ht 68.0 in | Wt 153.0 lb

## 2015-01-11 DIAGNOSIS — F411 Generalized anxiety disorder: Secondary | ICD-10-CM | POA: Diagnosis not present

## 2015-01-11 DIAGNOSIS — Z0000 Encounter for general adult medical examination without abnormal findings: Secondary | ICD-10-CM

## 2015-01-11 DIAGNOSIS — F324 Major depressive disorder, single episode, in partial remission: Secondary | ICD-10-CM | POA: Insufficient documentation

## 2015-01-11 DIAGNOSIS — F3341 Major depressive disorder, recurrent, in partial remission: Secondary | ICD-10-CM | POA: Diagnosis not present

## 2015-01-11 DIAGNOSIS — J069 Acute upper respiratory infection, unspecified: Secondary | ICD-10-CM

## 2015-01-11 DIAGNOSIS — Z23 Encounter for immunization: Secondary | ICD-10-CM

## 2015-01-11 MED ORDER — INFLUENZA VAC SPLIT QUAD 0.5 ML IM SUSY
0.5000 mL | PREFILLED_SYRINGE | Freq: Once | INTRAMUSCULAR | Status: AC
  Administered 2015-01-11: 0.5 mL via INTRAMUSCULAR

## 2015-01-11 NOTE — Addendum Note (Signed)
Addended by: Luella Cook E on: 01/11/2015 11:56 AM   Modules accepted: Orders
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
## 2015-01-17 ENCOUNTER — Encounter: Payer: Self-pay | Admitting: Osteopathic Medicine

## 2015-01-17 ENCOUNTER — Ambulatory Visit (INDEPENDENT_AMBULATORY_CARE_PROVIDER_SITE_OTHER): Payer: BLUE CROSS/BLUE SHIELD | Admitting: Osteopathic Medicine

## 2015-01-17 VITALS — BP 121/76 | HR 76 | Wt 155.0 lb

## 2015-01-17 DIAGNOSIS — H6123 Impacted cerumen, bilateral: Secondary | ICD-10-CM | POA: Diagnosis not present

## 2015-01-17 DIAGNOSIS — J0120 Acute ethmoidal sinusitis, unspecified: Secondary | ICD-10-CM | POA: Diagnosis not present

## 2015-01-17 DIAGNOSIS — J069 Acute upper respiratory infection, unspecified: Secondary | ICD-10-CM

## 2015-01-17 DIAGNOSIS — R059 Cough, unspecified: Secondary | ICD-10-CM

## 2015-01-17 DIAGNOSIS — R05 Cough: Secondary | ICD-10-CM

## 2015-01-17 MED ORDER — BENZONATATE 200 MG PO CAPS: 200.0000 mg | ORAL_CAPSULE | Freq: Two times a day (BID) | ORAL | Status: DC | PRN

## 2015-01-17 MED ORDER — AMOXICILLIN-POT CLAVULANATE 875-125 MG PO TABS: 1.0000 | ORAL_TABLET | Freq: Two times a day (BID) | ORAL | Status: DC

## 2015-01-17 MED ORDER — FLUTICASONE PROPIONATE 50 MCG/ACT NA SUSP: 2.0000 | Freq: Every day | NASAL | Status: DC

## 2015-01-17 NOTE — Progress Notes (Signed)
HPI: Dawn Moreno is a 34 y.o. female who presents to [facility]  today for chief complaint of:  Chief Complaint  Patient presents with  . Acute Visit    lost voice, cold/chills/ 0 fever, nasal drainage, cough   Throat scratchy onset 7 day ago, fatigue 8 days ago, no fever, (+) subjective chills, dry cough and some sneezing, has taken Tylenol and Mucinex. (+)sinus pressure, was feeling a bit dizzy this morning so decided to come in to be seen. (+) sinus pressure and drainage. (+)dry cough, No SOB  Past medical, social and family history reviewed: Past Medical History  Diagnosis Date  . Anxiety and depression    Past Surgical History  Procedure Laterality Date  . Wisdom tooth extraction     Social History  Substance Use Topics  . Smoking status: Never Smoker   . Smokeless tobacco: Never Used  . Alcohol Use: 0.0 oz/week    0 Standard drinks or equivalent per week   Family History  Problem Relation Age of Onset  . Depression Father   . Diabetes Father   . Multiple myeloma Mother   . Diabetes Maternal Aunt   . Lung cancer Maternal Grandmother     Current Outpatient Prescriptions  Medication Sig Dispense Refill  . buPROPion (WELLBUTRIN XL) 150 MG 24 hr tablet Take 1 tablet (150 mg total) by mouth daily. 30 tablet 0  . clonazePAM (KLONOPIN) 0.5 MG tablet Take 1 tablet (0.5 mg total) by mouth daily. 30 tablet 0  . L-Methylfolate (DEPLIN PO) Take 14 mg by mouth.    Marland Kitchen l-methylfolate-B6-B12 (METANX) 3-35-2 MG TABS Take 1 tablet by mouth daily.    . Lurasidone HCl (LATUDA) 20 MG TABS Take by mouth.    . Norethindrone-Ethinyl Estradiol-Fe Biphas (LO LOESTRIN FE) 1 MG-10 MCG / 10 MCG tablet Take 1 tablet by mouth daily.     No current facility-administered medications for this visit.   No Known Allergies   Review of Systems: CONSTITUTIONAL: subjective fever/chills, no unintentional weight changes HEAD/EYES/EARS/NOSE/THROAT: No headache/vision  change or hearing change, no sore throat, scratchy throat with some loss of voice/hoarseness CARDIAC: No chest pain/pressure/palpitations, no orthopnea RESPIRATORY: Dry cough/ NOshortness of breath/wheeze GASTROINTESTINAL: No nausea/vomiting/abdominal pain/blood in stool/diarrhea/constipation MUSCULOSKELETAL: No myalgia/arthralgia   Exam:  BP 121/76 mmHg  Pulse 76  Wt 155 lb (70.308 kg)  SpO2 94% Constitutional: VSS, see above. General Appearance: alert, well-developed, well-nourished, NAD Eyes: Normal lids and conjunctive, non-icteric sclera, PERRLA Ears, Nose, Mouth, Throat: Normal external inspection ears/nares/mouth/lips/gums, TM obscured by cerumen bilaterally, MMM, posterior pharynx slight erythema, no exudate. Pt declines ear irrigation.  Neck: No masses, trachea midline. No thyroid enlargement/tenderness/mass appreciated Respiratory: Normal respiratory effort. No dullness/hyper-resonance to percussion. Breath sounds normal, no wheeze/rhonchi/rales Cardiovascular: S1/S2 normal, no murmur/rub/gallop auscultated.   No results found for this or any previous visit (from the past 72 hour(s)).    ASSESSMENT/PLAN:  Acute upper respiratory infection  Acute ethmoidal sinusitis, recurrence not specified - Plan: fluticasone (FLONASE) 50 MCG/ACT nasal spray, amoxicillin-clavulanate (AUGMENTIN) 875-125 MG per tablet  Cough - Plan: benzonatate (TESSALON) 200 MG capsule  Excessive cerumen in both ear canals  Declines irrigation of ears. Recommend antihistamine and above medications, continue Tylenol as needed. RTC if no improvement.

## 2015-02-23 ENCOUNTER — Ambulatory Visit (INDEPENDENT_AMBULATORY_CARE_PROVIDER_SITE_OTHER): Payer: BLUE CROSS/BLUE SHIELD | Admitting: Family Medicine

## 2015-02-23 ENCOUNTER — Encounter: Payer: Self-pay | Admitting: Family Medicine

## 2015-02-23 VITALS — BP 129/86 | HR 79 | Wt 159.0 lb

## 2015-02-23 DIAGNOSIS — S338XXA Sprain of other parts of lumbar spine and pelvis, initial encounter: Secondary | ICD-10-CM | POA: Diagnosis not present

## 2015-02-23 DIAGNOSIS — M545 Low back pain, unspecified: Secondary | ICD-10-CM | POA: Insufficient documentation

## 2015-02-23 DIAGNOSIS — S39012A Strain of muscle, fascia and tendon of lower back, initial encounter: Secondary | ICD-10-CM

## 2015-02-23 DIAGNOSIS — M546 Pain in thoracic spine: Secondary | ICD-10-CM | POA: Insufficient documentation

## 2015-02-23 MED ORDER — NAPROXEN 500 MG PO TABS: 500.0000 mg | ORAL_TABLET | Freq: Two times a day (BID) | ORAL | Status: DC

## 2015-02-23 MED ORDER — METHOCARBAMOL 500 MG PO TABS: 500.0000 mg | ORAL_TABLET | Freq: Three times a day (TID) | ORAL | Status: DC

## 2015-02-23 NOTE — Progress Notes (Signed)
   Subjective:    I'm seeing this patient as a consultation for:  Dr Lyn HollingsheadAlexander  CC: Back pain  HPI: Patient is a 3-5 day history of low back pain. Pain worsened over the weekend. She notes the pain as a bandlike across her lower back. The pain does not radiate. No weakness or numbness bowel bladder dysfunction or difficulty walking. Pain is worse with extension and prolonged standing. No fevers chills nausea vomiting or diarrhea. No injury. She has tried some over-the-counter medicines which did not help much. She also tried some of her husbands hydromorphone which did help.  Past medical history, Surgical history, Family history not pertinant except as noted below, Social history, Allergies, and medications have been entered into the medical record, reviewed, and no changes needed.   Review of Systems: No headache, visual changes, nausea, vomiting, diarrhea, constipation, dizziness, abdominal pain, skin rash, fevers, chills, night sweats, weight loss, swollen lymph nodes, body aches, joint swelling, muscle aches, chest pain, shortness of breath, mood changes, visual or auditory hallucinations.   Objective:    Filed Vitals:   02/23/15 1558  BP: 129/86  Pulse: 79   General: Well Developed, well nourished, and in no acute distress.  Neuro/Psych: Alert and oriented x3, extra-ocular muscles intact, able to move all 4 extremities, sensation grossly intact. Skin: Warm and dry, no rashes noted.  Respiratory: Not using accessory muscles, speaking in full sentences, trachea midline.  Cardiovascular: Pulses palpable, no extremity edema. Abdomen: Does not appear distended. MSK: Back: Nontender to midline. Mildly tender to palpation right SI joint. Mildly tender to palpation bilateral lumbar paraspinals. Range of motion is intact and normal. Negative straight leg raise test. Laboratory strength is equal and normal bilaterally. Reflexes are intact throughout. Sensation is intact throughout.  No  results found for this or any previous visit (from the past 24 hour(s)). No results found.  Impression and Recommendations:   This case required medical decision making of moderate complexity.

## 2015-02-23 NOTE — Patient Instructions (Signed)
Thank you for coming in today. Come back or go to the emergency room if you notice new weakness new numbness problems walking or bowel or bladder problems.  Lumbosacral Strain Lumbosacral strain is a strain of any of the parts that make up your lumbosacral vertebrae. Your lumbosacral vertebrae are the bones that make up the lower third of your backbone. Your lumbosacral vertebrae are held together by muscles and tough, fibrous tissue (ligaments).  CAUSES  A sudden blow to your back can cause lumbosacral strain. Also, anything that causes an excessive stretch of the muscles in the low back can cause this strain. This is typically seen when people exert themselves strenuously, fall, lift heavy objects, bend, or crouch repeatedly. RISK FACTORS  Physically demanding work.  Participation in pushing or pulling sports or sports that require a sudden twist of the back (tennis, golf, baseball).  Weight lifting.  Excessive lower back curvature.  Forward-tilted pelvis.  Weak back or abdominal muscles or both.  Tight hamstrings. SIGNS AND SYMPTOMS  Lumbosacral strain may cause pain in the area of your injury or pain that moves (radiates) down your leg.  DIAGNOSIS Your health care provider can often diagnose lumbosacral strain through a physical exam. In some cases, you may need tests such as X-ray exams.  TREATMENT  Treatment for your lower back injury depends on many factors that your clinician will have to evaluate. However, most treatment will include the use of anti-inflammatory medicines. HOME CARE INSTRUCTIONS   Avoid hard physical activities (tennis, racquetball, waterskiing) if you are not in proper physical condition for it. This may aggravate or create problems.  If you have a back problem, avoid sports requiring sudden body movements. Swimming and walking are generally safer activities.  Maintain good posture.  Maintain a healthy weight.  For acute conditions, you may put ice on  the injured area.  Put ice in a plastic bag.  Place a towel between your skin and the bag.  Leave the ice on for 20 minutes, 2-3 times a day.  When the low back starts healing, stretching and strengthening exercises may be recommended. SEEK MEDICAL CARE IF:  Your back pain is getting worse.  You experience severe back pain not relieved with medicines. SEEK IMMEDIATE MEDICAL CARE IF:   You have numbness, tingling, weakness, or problems with the use of your arms or legs.  There is a change in bowel or bladder control.  You have increasing pain in any area of the body, including your belly (abdomen).  You notice shortness of breath, dizziness, or feel faint.  You feel sick to your stomach (nauseous), are throwing up (vomiting), or become sweaty.  You notice discoloration of your toes or legs, or your feet get very cold. MAKE SURE YOU:   Understand these instructions.  Will watch your condition.  Will get help right away if you are not doing well or get worse.   This information is not intended to replace advice given to you by your health care provider. Make sure you discuss any questions you have with your health care provider.   Document Released: 01/31/2005 Document Revised: 05/14/2014 Document Reviewed: 12/10/2012 Elsevier Interactive Patient Education [employer]2016 Elsevier Inc.

## 2015-02-23 NOTE — Assessment & Plan Note (Signed)
Since his most likely lumbosacral strain. Treat with naproxen methocarbamol and physical therapy. Return in 3 weeks.

## 2015-02-25 ENCOUNTER — Encounter (INDEPENDENT_AMBULATORY_CARE_PROVIDER_SITE_OTHER): Payer: Self-pay

## 2015-02-25 ENCOUNTER — Ambulatory Visit (INDEPENDENT_AMBULATORY_CARE_PROVIDER_SITE_OTHER): Payer: BLUE CROSS/BLUE SHIELD | Admitting: Rehabilitative and Restorative Service Providers"

## 2015-02-25 ENCOUNTER — Encounter: Payer: Self-pay | Admitting: Rehabilitative and Restorative Service Providers"

## 2015-02-25 ENCOUNTER — Telehealth: Payer: Self-pay | Admitting: Family Medicine

## 2015-02-25 DIAGNOSIS — Z7409 Other reduced mobility: Secondary | ICD-10-CM

## 2015-02-25 DIAGNOSIS — M2560 Stiffness of unspecified joint, not elsewhere classified: Secondary | ICD-10-CM

## 2015-02-25 DIAGNOSIS — M545 Low back pain, unspecified: Secondary | ICD-10-CM

## 2015-02-25 DIAGNOSIS — M623 Immobility syndrome (paraplegic): Secondary | ICD-10-CM

## 2015-02-25 MED ORDER — TRAMADOL HCL 50 MG PO TABS: 50.0000 mg | ORAL_TABLET | Freq: Three times a day (TID) | ORAL | Status: DC | PRN

## 2015-02-25 NOTE — Telephone Encounter (Signed)
Pain control. Trial of tramadol. Return as directed.

## 2015-02-25 NOTE — Telephone Encounter (Signed)
Patient walked into clinic today prior to her PT appointment stating the Rx for Robaxin and Naproxen are not helping. Pt states she is going to swing back by after her PT appt to see if there is another recommendation by Dr. Denyse Amassorey or if her Rx's can be changed. Will route.

## 2015-02-25 NOTE — Therapy (Signed)
[facility] [place] [address] [place], [place], [address] Phone: [phone]   Fax:  [phone]  Physical Therapy Evaluation  Patient Details  Name: Dawn Moreno MRN: [id number] Date of Birth: 06-30-1980 Referring Provider: dr. Clementeen Graham  Encounter Date: 02/25/2015      PT End of Session - 02/25/15 0931    Visit Number 1   Number of Visits 12   Date for PT Re-Evaluation 04/08/15   PT Start Time 0934   PT Stop Time 1031   PT Time Calculation (min) 57 min   Activity Tolerance Patient tolerated treatment well      Past Medical History  Diagnosis Date  . Anxiety and depression     Past Surgical History  Procedure Laterality Date  . Wisdom tooth extraction      There were no vitals filed for this visit.  Visit Diagnosis:  Bilateral low back pain without sciatica - Plan: PT plan of care cert/re-cert  Stiffness due to immobility - Plan: PT plan of care cert/re-cert  Impaired functional mobility and endurance - Plan: PT plan of care cert/re-cert      Subjective Assessment - 02/25/15 0935    Subjective Pt reports gradual onset of LBP over the past few weeks. Sitting is uncomfortable. She was seen by MD yesterday and treated with medication and encouraged to continue moving. She has pain with functional activities.    Pertinent History Fx Rt ankle 2003; Fx Rt hand; ADHD; depression    How long can you sit comfortably? 0 - 5 min    How long can you stand comfortably? 5-10 min   How long can you walk comfortably? 10-15 min   Patient Stated Goals make back pain less and return to exercise    Currently in Pain? Yes   Pain Score 3    Pain Location Back   Pain Orientation Mid;Upper   Pain Descriptors / Indicators Aching;Stabbing   Pain Type Acute pain   Pain Onset 1 to 4 weeks ago   Pain Frequency Intermittent   Aggravating Factors  gardening; movement; sitting; standing; lifting; reaching    Pain Relieving Factors  lying in bed with feet and head up with adjustable bed             [facility] PT Assessment - 02/25/15 0001    Assessment   Medical Diagnosis lumbosacral strain    Referring Provider dr. Clementeen Graham   Onset Date/Surgical Date 02/20/15   Hand Dominance Right   Next MD Visit no scheduled appt   Prior Therapy none   Precautions   Precautions None   Balance Screen   Has the patient fallen in the past 6 months No   Has the patient had a decrease in activity level because of a fear of falling?  No   Is the patient reluctant to leave their home because of a fear of falling?  No   Home Environment   Additional Comments no difficulty with entering and exiting home   Prior Function   Level of Independence Independent   Vocation Unemployed   Leisure household chores; gardening; Biomedical engineer; cooking standing at counter - intermittently does yoga/hiking/bike riding   Observation/Other Assessments   Focus on Therapeutic Outcomes (FOTO)  60% limitation   Sensation   Additional Comments WFL's    Posture/Postural Control   Posture Comments head forward ; shoulders slightly rounded; weight shifted to Lt; Rt LE ER    AROM   Lumbar  Flexion 100%   Lumbar Extension 50%   Lumbar - Right Side Bend 80%  discomfort LB waist level   Lumbar - Left Side Bend 80%  discomfort LB ~ waist level   Lumbar - Right Rotation 35%  tightness Rt LB   Lumbar - Left Rotation 30%  tightness and pulling Rt LB   Flexibility   Hamstrings Lt 78 deg; Rt 82 deg   ITB tight Lt > Rt   Piriformis tight Rt > Lt                   [facility] Adult PT Treatment/Exercise - 02/25/15 0001    Self-Care   Self-Care --  education re positions for home    Neuro Re-ed    Neuro Re-ed Details  postural correction   Lumbar Exercises: Stretches   Prone on Elbows Stretch --  1-2 min    Press Ups --  10 reps 2-3 sec hold    Lumbar Exercises: Supine   AB Set Limitations 3 part core 10 sec hold 10 reps     Moist Heat Therapy   Number Minutes Moist Heat 15 Minutes   Moist Heat Location Lumbar Spine   Electrical Stimulation   Electrical Stimulation Location lumbar/thoracic spine    Electrical Stimulation Action IFC   Electrical Stimulation Parameters to tolerance   Electrical Stimulation Goals Pain                PT Education - 02/25/15 1013    Education provided Yes   Education Details spine care; HEP   Person(s) Educated Patient   Methods Explanation;Demonstration;Tactile cues;Verbal cues;Handout   Comprehension Verbalized understanding;Returned demonstration;Verbal cues required;Tactile cues required             PT Long Term Goals - 02/25/15 1320    PT LONG TERM GOAL #1   Title Patient to demonstrate improved posture and alignment 04/08/15   Time 6   Period Weeks   Status New   PT LONG TERM GOAL #2   Title Improve spinal extension to 75-80% with prone press up 03/18/15   Time 3   Period Weeks   Status New   PT LONG TERM GOAL #3   Title Improve hamstring flexibility to 80 to 85 deg bilat 04/08/15   Time 6   Period Weeks   Status New   PT LONG TERM GOAL #4   Title Patient to tolerate sitting; standing; walking for 20-30 min 04/08/15   Time 6   Period Weeks   Status New   PT LONG TERM GOAL #5   Title Improve FOTO to </= 35% limitation 04/08/15   Time 6   Period Weeks   Status New               Plan - 02/25/15 1316    Clinical Impression Statement Patient presents with 2-3 week history of LBP. She has poor, asymetrical posture; limited spinal mobility; tightness through hips; tenderness with spring testing through lumbar spine; poor understanding of positional infulences on musculaskeletal pain and dysfunction. Pt will benefit form PT to address problems identified.    Pt will benefit from skilled therapeutic intervention in order to improve on the following deficits Postural dysfunction;Improper body mechanics;Pain;Decreased range of motion;Decreased  mobility;Increased fascial restricitons;Decreased endurance;Decreased activity tolerance   Rehab Potential Good   PT Frequency 2x / week   PT Duration 6 weeks   PT Treatment/Interventions Patient/family education;ADLs/Self Care Home Management;Manual techniques;Dry needling;Neuromuscular re-education;Cryotherapy;Electrical Stimulation;Moist Heat;Ultrasound;Therapeutic exercise;Therapeutic activities  PT Next Visit Plan continue postural education; stretching; core stabilization; manual work; modalities   PT Home Exercise Plan to work on positions for neutral spine; extension program; core stabilization   Consulted and Agree with Plan of Care Patient         Problem List Patient Active Problem List   Diagnosis Date Noted  . Lumbosacral strain 02/23/2015  . Major depression in partial remission (HCC) 01/11/2015  . Generalized anxiety disorder 01/11/2015    Dawn Moreno Rober Minion PT, MPH 02/25/2015, 1:26 PM  [facility] [address] [place], [place], [address] Phone: [phone]   Fax:  [phone]  Name: Dawn Moreno MRN: [id number] Date of Birth: December 15, 1980

## 2015-02-25 NOTE — Patient Instructions (Signed)
Abdominal Bracing With Pelvic Floor (Hook-Lying)    With neutral spine, tighten pelvic floor and abdominals pulling belly button to back bone, tighten back at waist. Hold 10 sec Repeat _10__ times. Do _several__ times a day. Progress to doing this in sitting; standing; functional activities.    Trunk Extension    Standing, place back of open hands on low back. Straighten spine then arch the back and move shoulders back. Repeat __2-3__ times per session. Do _several___ sessions per day   Trunk: Prone Extension (Press-Ups)    Lie on stomach on firm, flat surface. Relax bottom and legs. Raise chest in air with elbows straight. Keep hips flat on surface, sag stomach. Hold __2-3__ seconds. Repeat __10__ times. Do __3-4__ sessions per day. CAUTION: Movement should be gentle and slow.

## 2015-02-28 ENCOUNTER — Ambulatory Visit (INDEPENDENT_AMBULATORY_CARE_PROVIDER_SITE_OTHER): Payer: BLUE CROSS/BLUE SHIELD | Admitting: Physical Therapy

## 2015-02-28 ENCOUNTER — Encounter (INDEPENDENT_AMBULATORY_CARE_PROVIDER_SITE_OTHER): Payer: Self-pay

## 2015-02-28 DIAGNOSIS — M545 Low back pain, unspecified: Secondary | ICD-10-CM

## 2015-02-28 DIAGNOSIS — M2560 Stiffness of unspecified joint, not elsewhere classified: Secondary | ICD-10-CM

## 2015-02-28 DIAGNOSIS — Z7409 Other reduced mobility: Secondary | ICD-10-CM | POA: Diagnosis not present

## 2015-02-28 DIAGNOSIS — M623 Immobility syndrome (paraplegic): Secondary | ICD-10-CM

## 2015-02-28 NOTE — Therapy (Signed)
[facility] [place] [address] [place], [place], [address] Phone: [phone]   Fax:  [phone]  Physical Therapy Treatment  Patient Details  Name: Dawn Moreno MRN: [id number] Date of Birth: Aug 26, 1980 Referring Provider: Dr. Clementeen Graham  Encounter Date: 02/28/2015      PT End of Session - 02/28/15 1107    Visit Number 2   Number of Visits 12   Date for PT Re-Evaluation 04/08/15   PT Start Time 1101   PT Stop Time 1204   PT Time Calculation (min) 63 min      Past Medical History  Diagnosis Date  . Anxiety and depression     Past Surgical History  Procedure Laterality Date  . Wisdom tooth extraction      There were no vitals filed for this visit.  Visit Diagnosis:  Bilateral low back pain without sciatica  Stiffness due to immobility  Impaired functional mobility and endurance      Subjective Assessment - 02/28/15 1103    Subjective Pt reports she has been compliant with HEP.  Feels the exercises and anti-inflammatory are helping.    Currently in Pain? Yes   Pain Score 1    Pain Location Back   Pain Orientation Lower  (PSIS region)   Pain Descriptors / Indicators Dull   Aggravating Factors  standing, walking on hard surface.    Pain Relieving Factors lying flat with legs supported            [facility] PT Assessment - 02/28/15 0001    Assessment   Medical Diagnosis lumbosacral strain    Referring Provider Dr. Clementeen Graham   Onset Date/Surgical Date 02/20/15   Hand Dominance Right   Next MD Visit PRN            [facility] Adult PT Treatment/Exercise - 02/28/15 0001    Exercises   Exercises Knee/Hip;Lumbar   Lumbar Exercises: Stretches   Passive Hamstring Stretch 2 reps;30 seconds  each leg with strap   Press Ups 5 reps  3 sec hold   ITB Stretch 2 reps  each leg with strap   ITB Stretch Limitations (also adductor stretch with strap x 30 sec each leg)   Lumbar Exercises: Aerobic   Stationary  Bike NuStep L3: 5 min    Lumbar Exercises: Prone   Other Prone Lumbar Exercises Pt instructed in MFR with ball to Lt hip flexor/ psoas: tender points held ~1-2 min    Knee/Hip Exercises: Stretches   Gastroc Stretch Right;Left;2 reps;30 seconds   Modalities   Modalities Electrical Stimulation;Cryotherapy   Cryotherapy   Number Minutes Cryotherapy 15 Minutes   Cryotherapy Location Lumbar Spine   Type of Cryotherapy Ice pack   Programme researcher, broadcasting/film/video Location SI/ QL    Electrical Stimulation Action IFC   Electrical Stimulation Parameters to tolerance x 15 min    Electrical Stimulation Goals Pain   Manual Therapy   Manual Therapy Myofascial release   Myofascial Release MFR to Rt ITB, Piriformis, Lt adductors, psoas                 PT Education - 02/28/15 1345    Education provided Yes   Education Details self MFR with ball to Lt hipflexor/psoas in prone, continue HEP    Person(s) Educated Patient   Methods Explanation   Comprehension Verbalized understanding;Returned demonstration             PT Long Term Goals - 02/25/15 1320  PT LONG TERM GOAL #1   Title Patient to demonstrate improved posture and alignment 04/08/15   Time 6   Period Weeks   Status New   PT LONG TERM GOAL #2   Title Improve spinal extension to 75-80% with prone press up 03/18/15   Time 3   Period Weeks   Status New   PT LONG TERM GOAL #3   Title Improve hamstring flexibility to 80 to 85 deg bilat 04/08/15   Time 6   Period Weeks   Status New   PT LONG TERM GOAL #4   Title Patient to tolerate sitting; standing; walking for 20-30 min 04/08/15   Time 6   Period Weeks   Status New   PT LONG TERM GOAL #5   Title Improve FOTO to </= 35% limitation 04/08/15   Time 6   Period Weeks   Status New               Plan - 02/28/15 1344    Clinical Impression Statement Pt had tightness in Rt ITB and QL, Lt adductors and psoas with exercise and manual therapy;  those areas addressed during session. Pt tolerated exercises without increase in pain.  Progressing towards goals.    Pt will benefit from skilled therapeutic intervention in order to improve on the following deficits Postural dysfunction;Improper body mechanics;Pain;Decreased range of motion;Decreased mobility;Increased fascial restricitons;Decreased endurance;Decreased activity tolerance   Rehab Potential Good   PT Frequency 2x / week   PT Duration 6 weeks   PT Treatment/Interventions Patient/family education;ADLs/Self Care Home Management;Manual techniques;Dry needling;Neuromuscular re-education;Cryotherapy;Electrical Stimulation;Moist Heat;Ultrasound;Therapeutic exercise;Therapeutic activities   PT Next Visit Plan continue postural education; stretching; core stabilization; manual work; modalities        Problem List Patient Active Problem List   Diagnosis Date Noted  . Lumbosacral strain 02/23/2015  . Major depression in partial remission (HCC) 01/11/2015  . Generalized anxiety disorder 01/11/2015   Mayer CamelJennifer Carlson-Long, PTA 02/28/2015 1:49 PM  [facility] [place] [address] [place], [place], [address] Phone: [phone]   Fax:  [phone]  Name: Dawn Moreno MRN: [id number] Date of Birth: 1980/07/21

## 2015-03-03 ENCOUNTER — Encounter: Payer: Self-pay | Admitting: Rehabilitative and Restorative Service Providers"

## 2015-03-04 ENCOUNTER — Ambulatory Visit (INDEPENDENT_AMBULATORY_CARE_PROVIDER_SITE_OTHER): Payer: BLUE CROSS/BLUE SHIELD | Admitting: Osteopathic Medicine

## 2015-03-04 ENCOUNTER — Encounter: Payer: Self-pay | Admitting: Osteopathic Medicine

## 2015-03-04 ENCOUNTER — Telehealth: Payer: Self-pay

## 2015-03-04 VITALS — BP 129/84 | HR 85 | Wt 160.0 lb

## 2015-03-04 DIAGNOSIS — S338XXA Sprain of other parts of lumbar spine and pelvis, initial encounter: Secondary | ICD-10-CM

## 2015-03-04 DIAGNOSIS — S39012A Strain of muscle, fascia and tendon of lower back, initial encounter: Secondary | ICD-10-CM

## 2015-03-04 DIAGNOSIS — Z9189 Other specified personal risk factors, not elsewhere classified: Secondary | ICD-10-CM

## 2015-03-04 MED ORDER — TRAMADOL-ACETAMINOPHEN 37.5-325 MG PO TABS: 1.0000 | ORAL_TABLET | Freq: Three times a day (TID) | ORAL | Status: DC | PRN

## 2015-03-04 NOTE — Telephone Encounter (Signed)
Patient states she was taking the Tramadol TID for a week. She is now experiencing withdraw symptoms such as sweating and feeling anxious. What do you recommend for the withdraws.

## 2015-03-04 NOTE — Telephone Encounter (Signed)
Patient advised and scheduled.  

## 2015-03-04 NOTE — Progress Notes (Signed)
HPI: Dawn Moreno is a 34 y.o. female who presents to [place]  today for chief complaint of:  Chief Complaint  Patient presents with  . Acute Visit    ?? side effects from medication   Back pain and conern for withdrawal from Tramadol . Quality: feeling chills and sweating . Severity: moderate . Duration: 2 days . Context: going to PT and this is helping.  . Modifying factors: Naprosyn and muscle relaxers done first, then Tramadol 50 mg 3 - 4 times per day was tried which helped. Stopped this 3 days ago. Sweating and sleep problems started maybe 24 hours. . Assoc signs/symptoms: anxiety.      Past medical, social and family history reviewed: Past Medical History  Diagnosis Date  . Anxiety and depression    Past Surgical History  Procedure Laterality Date  . Wisdom tooth extraction     Social History  Substance Use Topics  . Smoking status: Never Smoker   . Smokeless tobacco: Never Used  . Alcohol Use: 0.0 oz/week    0 Standard drinks or equivalent per week   Family History  Problem Relation Age of Onset  . Depression Father   . Diabetes Father   . Multiple myeloma Mother   . Diabetes Maternal Aunt   . Lung cancer Maternal Grandmother     Current Outpatient Prescriptions  Medication Sig Dispense Refill  . ARIPiprazole (ABILIFY) 10 MG tablet Take 10 mg by mouth daily.    Marland Kitchen buPROPion (WELLBUTRIN XL) 150 MG 24 hr tablet Take 1 tablet (150 mg total) by mouth daily. 30 tablet 0  . clonazePAM (KLONOPIN) 0.5 MG tablet Take 1 tablet (0.5 mg total) by mouth daily. 30 tablet 0  . fluticasone (FLONASE) 50 MCG/ACT nasal spray Place 2 sprays into both nostrils daily. 16 g 3  . L-Methylfolate (DEPLIN PO) Take 14 mg by mouth.    . methocarbamol (ROBAXIN) 500 MG tablet Take 1 tablet (500 mg total) by mouth 3 (three) times daily. 90 tablet 0  . naproxen (NAPROSYN) 500 MG tablet Take 1 tablet (500 mg total) by mouth 2 (two) times daily with a  meal. 60 tablet 0  . Norethindrone-Ethinyl Estradiol-Fe Biphas (LO LOESTRIN FE) 1 MG-10 MCG / 10 MCG tablet Take 1 tablet by mouth daily.     No current facility-administered medications for this visit.   No Known Allergies    Review of Systems: CONSTITUTIONAL:  No  fever, yes chills and sweating, No  unintentional weight changes HEAD/EYES/EARS/NOSE/THROAT: No headache, no vision change, no hearing change, No  sore throat, no nose or eye runniness, (+) sinus congestion/stuffy CARDIAC: No chest pain, no pressure/palpitations, no orthopnea RESPIRATORY: No  cough, No  shortness of breath/wheeze GASTROINTESTINAL: No nausea, no vomiting, no abdominal pain, no blood in stool, no diarrhea, no constipation MUSCULOSKELETAL: Yes  Myalgia/arthralgia but no different form previous back pain (no new generalized myalgia or new arthralgia) GENITOURINARY: No incontinence, No abnormal genital bleeding/discharge SKIN: No rash/wounds/concerning lesions HEM/ONC: No easy bruising/bleeding, no abnormal lymph node ENDOCRINE: No polyuria/polydipsia/polyphagia, no heat/cold intolerance  NEUROLOGIC: No weakness, no dizziness, no slurred speech, no tremor PSYCHIATRIC: No concerns with depression, no concerns with anxiety, no sleep problems    Exam:  BP 129/84 mmHg  Pulse 85  Wt 160 lb (72.576 kg)  SpO2 96% Constitutional: VSS, see above. General Appearance: alert, well-developed, well-nourished, NAD Eyes: Normal lids and conjunctive, non-icteric sclera, normal pupil size, PERRLA Ears, Nose, Mouth, Throat: Normal external  inspection ears/nares/mouth/lips/gums, TM obscured by cerumen bilaterally, MMM, posterior pharynx No  erythema No  exudate Neck: No masses, trachea midline. No thyroid enlargement/tenderness/mass appreciated. No lymphadenopathy Respiratory: Normal respiratory effort. no wheeze, no rhonchi, no rales Cardiovascular: S1/S2 normal, no murmur, no rub/gallop auscultated. RRR.  Musculoskeletal: Gait  normal. No clubbing/cyanosis of digits.  Neurological: No cranial nerve deficit on limited exam. Motor and sensation intact and symmetric, no tremor Psychiatric: Normal judgment/insight. Anxious mood and affect. Oriented x3.    No results found for this or any previous visit (from the past 72 hour(s)).  Clinical Opioid Withdrawal Scale = 2 = mild symptoms   ASSESSMENT/PLAN:  At risk for side effect of medication - reassured low low risk of opiate withdrawal, more likely sweating due to viral prodrome or other illness, particularly with sinus complaints as well. Pt is ok to try Tramadol at lower dose, will trial Ultracet prn for back pain, advised use sparingly and RTC/ER if any further concerns for side effects of medication.   Lumbosacral strain, initial encounter - see above  Return in about 2 weeks (around 03/18/2015), or if symptoms worsen or fail to improve, for WITH DR [place].

## 2015-03-04 NOTE — Telephone Encounter (Signed)
Symptoms should pass soon. However 1 week of tramadol should not prompt significant withdrawal symptoms. Patient may follow up with Dr. Lyn HollingsheadAlexander this afternoon PRN.

## 2015-03-07 ENCOUNTER — Ambulatory Visit (INDEPENDENT_AMBULATORY_CARE_PROVIDER_SITE_OTHER): Payer: BLUE CROSS/BLUE SHIELD | Admitting: Physical Therapy

## 2015-03-07 DIAGNOSIS — M2560 Stiffness of unspecified joint, not elsewhere classified: Secondary | ICD-10-CM

## 2015-03-07 DIAGNOSIS — M545 Low back pain, unspecified: Secondary | ICD-10-CM

## 2015-03-07 DIAGNOSIS — Z7409 Other reduced mobility: Secondary | ICD-10-CM | POA: Diagnosis not present

## 2015-03-07 DIAGNOSIS — M623 Immobility syndrome (paraplegic): Secondary | ICD-10-CM

## 2015-03-07 NOTE — Therapy (Signed)
[place], [place], [address] Phone: [phone]   Fax:  [phone]  Physical Therapy Treatment  Patient Details  Name: Dawn Moreno MRN: [id number] Date of Birth: 10/29/1980 Referring Provider: Dr. Georgina Snell  Encounter Date: 03/07/2015      PT End of Session - 03/07/15 1106    Visit Number 3   Number of Visits 12   Date for PT Re-Evaluation 04/08/15   PT Start Time 1104   PT Stop Time 1207   PT Time Calculation (min) 63 min   Activity Tolerance Patient tolerated treatment well;No increased pain      Past Medical History  Diagnosis Date  . Anxiety and depression     Past Surgical History  Procedure Laterality Date  . Wisdom tooth extraction      There were no vitals filed for this visit.  Visit Diagnosis:  Bilateral low back pain without sciatica  Stiffness due to immobility  Impaired functional mobility and endurance      Subjective Assessment - 03/07/15 1106    Subjective Pt reports her back is sore from sitting and carving pumpkins. Back pain is shifted a little.    Currently in Pain? Yes   Pain Score 4    Pain Location Back   Pain Orientation Lower   Pain Descriptors / Indicators Sharp;Dull   Aggravating Factors  standing, walking on hard surfaces, bending with curved back    Pain Relieving Factors lying flat with legs supported             [facility] PT Assessment - 03/07/15 0001    Assessment   Medical Diagnosis lumbosacral strain    Referring Provider Dr. Georgina Snell   Onset Date/Surgical Date 02/20/15   Hand Dominance Right   Next MD Visit PRN          [facility] Adult PT Treatment/Exercise - 03/07/15 0001    Lumbar Exercises: Stretches   Passive Hamstring Stretch 2 reps;30 seconds  each leg with strap   Press Ups --  10 reps, 3 sec hold   ITB Stretch 2 reps  each leg with strap   ITB Stretch Limitations (also adductor stretch with strap x 30 sec each leg)   Lumbar  Exercises: Aerobic   Stationary Bike NuStep L4: 5 min    Lumbar Exercises: Supine   Ab Set 5 reps;5 seconds   Clam 10 reps;1 second  each leg, with ab set   Heel Slides 10 reps  each leg, with ab set   Bent Knee Raise 10 reps  each leg, with ab set   Modalities   Modalities Electrical Stimulation;Moist Heat   Moist Heat Therapy   Number Minutes Moist Heat 15 Minutes   Moist Heat Location Lumbar Spine   Electrical Stimulation   Electrical Stimulation Location SI/ QL    Electrical Stimulation Action IFC   Electrical Stimulation Parameters to tolerance   Electrical Stimulation Goals Pain   Manual Therapy   Manual Therapy Myofascial release   Myofascial Release MFR to Rt adductors, ITB, Rt hip rotators, QL                      PT Long Term Goals - 03/07/15 1127    PT LONG TERM GOAL #1   Title Patient to demonstrate improved posture and alignment 04/08/15   Time 6   Period Weeks   Status On-going   PT LONG TERM GOAL #2   Title Improve  spinal extension to 75-80% with prone press up 03/18/15   Time 3   Period Weeks   Status On-going   PT LONG TERM GOAL #3   Title Improve hamstring flexibility to 80 to 85 deg bilat 04/08/15   Time 6   Period Weeks   Status On-going   PT LONG TERM GOAL #4   Title Patient to tolerate sitting; standing; walking for 20-30 min 04/08/15   Time 6   Period Weeks   Status Partially Met  can sit for 30 min, can stand 10 min only   PT LONG TERM GOAL #5   Title Improve FOTO to </= 35% limitation 04/08/15   Time 6   Period Weeks   Status On-going               Plan - 03/07/15 1126    Clinical Impression Statement Pt tolerated new exercises without increase in LBP.  Pt reported increased pain relief with MHP vs ice pack.  Pt point tender in Rt ITB, QL, and hip rotators with manual work; noted decreased tissue tension/ pain afterward.  Pt progressing towards goals; has partially met LTG # 4.    PT Next Visit Plan continue  postural education; stretching; core stabilization; manual work; modalities   Consulted and Agree with Plan of Care Patient        Problem List Patient Active Problem List   Diagnosis Date Noted  . Lumbosacral strain 02/23/2015  . Major depression in partial remission ([place]) 01/11/2015  . Generalized anxiety disorder 01/11/2015    Kerin Perna, PTA 03/07/2015 12:14 PM  [place], [place], [address] Phone: [phone]   Fax:  [phone]  Name: Madalyn Legner MRN: [id number] Date of Birth: Jan 14, 1981

## 2015-03-07 NOTE — Patient Instructions (Addendum)
  Abdominal Bracing With Pelvic Floor (Hook-Lying)   With neutral spine, tighten pelvic floor and abdominals. Hold 10 seconds. Repeat __10_ times. Do _1__ times a day.   Knee to Chest: Transverse Plane Stability   Tighten abdominals. Bring one knee up, then return. Be sure pelvis does not roll side to side. Keep pelvis still. Lift knee __10_ times each leg. Restabilize pelvis. Repeat with other leg. Do _1-2__ sets, _1__ times per day.   Hip External Rotation With Pillow: Transverse Plane Stability   Tighten abdominals.  (Can keep both knees bent).  One knee bent, one leg straight, on pillow. Slowly roll bent knee out. Be sure pelvis does not rotate. Do _10__ times. Restabilize pelvis. Repeat with other leg. Do _1-2__ sets, _1__ times per day.  Heel Slide: 4-10 Inches - Transverse Plane Stability  Tighten abdominals.  Slide heel 4 inches down. Be sure pelvis does not rotate. Do _10__ times. Restabilize pelvis. Repeat with other leg. Do __1_ sets, _1__ times per day.   [facility] [address] [place], [place] [address]  [phone] (office) [phone] (fax)  Outer Hip Stretch: Reclined IT Band Stretch (Strap)    Strap around opposite foot, pull across only as far as possible with shoulders on mat. Hold for __30__ seconds. Repeat _2___ times each leg.  Copyright  VHI. All rights reserved.  Adductor Stretch: Reclined (Strap, Wall)    Warm up with leg vertical. Rotate leg to side and fix foot to wall. Anchor opposite hip. Knee straight.  Hold for _30__ seconds. Repeat __2__ times each leg. BACK: Child's Pose (Sciatica)    Sit in knee-chest position and reach arms forward. Separate knees for comfort. Hold position for _5__ breaths. Repeat _2-3__ times. Walk hands to Right, center, Left

## 2015-03-09 ENCOUNTER — Encounter: Payer: Self-pay | Admitting: Obstetrics & Gynecology

## 2015-03-09 ENCOUNTER — Ambulatory Visit (INDEPENDENT_AMBULATORY_CARE_PROVIDER_SITE_OTHER): Payer: BLUE CROSS/BLUE SHIELD | Admitting: Obstetrics & Gynecology

## 2015-03-09 VITALS — BP 119/70 | HR 84 | Resp 16 | Ht 68.0 in | Wt 160.0 lb

## 2015-03-09 DIAGNOSIS — B373 Candidiasis of vulva and vagina: Secondary | ICD-10-CM

## 2015-03-09 DIAGNOSIS — Z1151 Encounter for screening for human papillomavirus (HPV): Secondary | ICD-10-CM | POA: Diagnosis not present

## 2015-03-09 DIAGNOSIS — Z124 Encounter for screening for malignant neoplasm of cervix: Secondary | ICD-10-CM | POA: Diagnosis not present

## 2015-03-09 DIAGNOSIS — B3731 Acute candidiasis of vulva and vagina: Secondary | ICD-10-CM

## 2015-03-09 DIAGNOSIS — Z0000 Encounter for general adult medical examination without abnormal findings: Secondary | ICD-10-CM

## 2015-03-09 DIAGNOSIS — Z01419 Encounter for gynecological examination (general) (routine) without abnormal findings: Secondary | ICD-10-CM

## 2015-03-09 MED ORDER — NORETHIN-ETH ESTRAD-FE BIPHAS 1 MG-10 MCG / 10 MCG PO TABS: 1.0000 | ORAL_TABLET | Freq: Every day | ORAL | Status: DC

## 2015-03-09 MED ORDER — FLUCONAZOLE 150 MG PO TABS: 150.0000 mg | ORAL_TABLET | Freq: Once | ORAL | Status: DC

## 2015-03-09 NOTE — Progress Notes (Signed)
Subjective:    Dawn Moreno is a 34 y.o. MW G0 female who presents for an annual exam. The patient has no complaints today. The patient is sexually active. GYN screening history: last pap: was normal. The patient wears seatbelts: yes. The patient participates in regular exercise: yes. Has the patient ever been transfused or tattooed?: no. The patient reports that there is not domestic violence in her life.   Menstrual History: OB History    Gravida Para Term Preterm AB TAB SAB Ectopic Multiple Living   0 0 0 0 0 0 0 0 0 0       Menarche age: [age]  Patient's last menstrual period was 02/16/2015.    The following portions of the patient's history were reviewed and updated as appropriate: allergies, current medications, past family history, past medical history, past social history, past surgical history and problem list.  Review of Systems Pertinent items are noted in HPI. Married for 5 years. Denies dyspareunia. Going to PT for back pain. Currently unemployed, previous Librarian, [employer] teacher. Got her flu vaccine. Fasting labs great.   Objective:    BP 119/70 mmHg  Pulse 84  Resp 16  Ht 5\' 8"  (1.727 m)  Wt 160 lb (72.576 kg)  BMI 24.33 kg/m2  LMP 02/16/2015  General Appearance:    Alert, cooperative, no distress, appears stated age  Head:    Normocephalic, without obvious abnormality, atraumatic  Eyes:    PERRL, conjunctiva/corneas clear, EOM's intact, fundi    benign, both eyes  Ears:    Normal TM's and external ear canals, both ears  Nose:   Nares normal, septum midline, mucosa normal, no drainage    or sinus tenderness  Throat:   Lips, mucosa, and tongue normal; teeth and gums normal  Neck:   Supple, symmetrical, trachea midline, no adenopathy;    thyroid:  no enlargement/tenderness/nodules; no carotid   bruit or JVD  Back:     Symmetric, no curvature, ROM normal, no CVA tenderness  Lungs:     Clear to auscultation bilaterally, respirations unlabored  Chest Wall:    No tenderness or  deformity   Heart:    Regular rate and rhythm, S1 and S2 normal, no murmur, rub   or gallop  Breast Exam:    No tenderness, masses, or nipple abnormality  Abdomen:     Soft, non-tender, bowel sounds active all four quadrants,    no masses, no organomegaly  Genitalia:    Normal female without lesion, discharge or tenderness, NSSR, NT, minimal mobility, very tight abs, yeast in vault Normal adnexal exam     Extremities:   Extremities normal, atraumatic, no cyanosis or edema  Pulses:   2+ and symmetric all extremities  Skin:   Skin color, texture, turgor normal, no rashes or lesions  Lymph nodes:   Cervical, supraclavicular, and axillary nodes normal  Neurologic:   CNII-XII intact, normal strength, sensation and reflexes    throughout  .    Assessment:    Healthy female exam.   yeast   Plan:     Breast self exam technique reviewed and patient encouraged to perform self-exam monthly. Thin prep Pap smear. with cotesting diflucan

## 2015-03-10 ENCOUNTER — Ambulatory Visit (INDEPENDENT_AMBULATORY_CARE_PROVIDER_SITE_OTHER): Payer: BLUE CROSS/BLUE SHIELD | Admitting: Physical Therapy

## 2015-03-10 DIAGNOSIS — M545 Low back pain, unspecified: Secondary | ICD-10-CM

## 2015-03-10 DIAGNOSIS — Z7409 Other reduced mobility: Secondary | ICD-10-CM

## 2015-03-10 DIAGNOSIS — M623 Immobility syndrome (paraplegic): Secondary | ICD-10-CM

## 2015-03-10 DIAGNOSIS — M2560 Stiffness of unspecified joint, not elsewhere classified: Secondary | ICD-10-CM

## 2015-03-10 NOTE — Patient Instructions (Signed)
Healthy Back Strengthening - Back Extension on All Fours    Start on hands and knees, keeping them apart. Straighten right leg and left arm at the same time. Hold _1___ seconds. Switch immediately and repeat with left leg and right arm. Do __5__ times. Increase repetitions gradually up to _10___. Increase each hold gradually up to __5__ seconds.   Adductor Stretch: Frog Pose    Start with toes touching, hips back. Move hips forward to line up with knees. Open feet to knee width, dorsiflex. Hold for _30___ seconds. Repeat __2-3__ times.  [facility] [address] [place], [place] [address]  [phone] (office) [phone] (fax)

## 2015-03-10 NOTE — Therapy (Signed)
[place], [place], [address] Phone: [phone]   Fax:  [phone]  Physical Therapy Treatment  Patient Details  Name: Dawn Moreno MRN: [id number] Date of Birth: August 31, 1980 Referring Provider: Dr. Georgina Snell  Encounter Date: 03/10/2015      PT End of Session - 03/10/15 1146    Visit Number 4   Number of Visits 12   Date for PT Re-Evaluation 04/08/15   PT Start Time 1146   PT Stop Time 6659   PT Time Calculation (min) 56 min   Activity Tolerance Patient tolerated treatment well;No increased pain      Past Medical History  Diagnosis Date  . Anxiety and depression   . Depression   . ADHD (attention deficit hyperactivity disorder)     Past Surgical History  Procedure Laterality Date  . Wisdom tooth extraction      There were no vitals filed for this visit.  Visit Diagnosis:  Bilateral low back pain without sciatica  Stiffness due to immobility  Impaired functional mobility and endurance      Subjective Assessment - 03/10/15 1149    Subjective Pt reports she has been having more back pain at bra strap, "it feels like it's all muscle (related pain)".   Grocery shopping went ok, without pain, but still having difficulty cooking without pain   Currently in Pain? Yes   Pain Score 3    Pain Location Back   Pain Orientation Mid   Pain Descriptors / Indicators Discomfort;Dull;Aching;Constant   Aggravating Factors  standing, bending over counter to cook.    Pain Relieving Factors lying flat with legs supported.             [facility] PT Assessment - 03/10/15 0001    Assessment   Medical Diagnosis lumbosacral strain    Referring Provider Dr. Georgina Snell   Onset Date/Surgical Date 02/20/15   Hand Dominance Right   Next MD Visit PRN   AROM   Lumbar - Right Rotation 80%   Lumbar - Left Rotation 90%          [facility] Adult PT Treatment/Exercise - 03/10/15 0001    Lumbar Exercises: Stretches    Prone on Elbows Stretch --  1-2 min    Press Ups 5 reps;10 seconds   ITB Stretch 2 reps  each leg with strap   ITB Stretch Limitations (also adductor stretch with strap x 30 sec each leg)   Lumbar Exercises: Aerobic   Stationary Bike NuStep L4: 6 min (with arms / legs)   Lumbar Exercises: Standing   Other Standing Lumbar Exercises Modified downward dog x 20 sec x 2 reps followed by cobra pose x 15 sec; modified triangle pose with tactile cues x 20 sec x 2 reps each side.    Lumbar Exercises: Sidelying   Other Sidelying Lumbar Exercises Thoracic rotation with arm in T x 5 reps, Y x 5 reps each side.    Lumbar Exercises: Prone   Other Prone Lumbar Exercises childs pose x 20 sec; with lateral trunk flexion x 20 sec x 2 reps   Lumbar Exercises: Quadruped   Opposite Arm/Leg Raise 5 reps;Right arm/Left leg;Left arm/Right leg;1 second  2 sets   Modalities   Modalities Electrical Stimulation   Moist Heat Therapy   Number Minutes Moist Heat 15 Minutes   Moist Heat Location Lumbar Spine  thoracic   Electrical Stimulation   Electrical Stimulation Location SI/ QL    Electrical Stimulation Action  IFC   Electrical Stimulation Parameters to  tolerance   Electrical Stimulation Goals Pain                PT Education - 03/10/15 1231    Education provided Yes   Education Details HEP- added opp arm leg/ frog stretch   Person(s) Educated Patient   Methods Explanation;Handout;Demonstration   Comprehension Returned demonstration;Verbalized understanding             PT Long Term Goals - 03/07/15 1127    PT LONG TERM GOAL #1   Title Patient to demonstrate improved posture and alignment 04/08/15   Time 6   Period Weeks   Status On-going   PT LONG TERM GOAL #2   Title Improve spinal extension to 75-80% with prone press up 03/18/15   Time 3   Period Weeks   Status On-going   PT LONG TERM GOAL #3   Title Improve hamstring flexibility to 80 to 85 deg bilat 04/08/15   Time 6    Period Weeks   Status On-going   PT LONG TERM GOAL #4   Title Patient to tolerate sitting; standing; walking for 20-30 min 04/08/15   Time 6   Period Weeks   Status Partially Met  can sit for 30 min, can stand 10 min only   PT LONG TERM GOAL #5   Title Improve FOTO to </= 35% limitation 04/08/15   Time 6   Period Weeks   Status On-going               Plan - 03/10/15 1209    Clinical Impression Statement Pt demonstrated increased tightness with Rt rotation in sidelying. Pt reporting decreased overall LBP, with improved sitting tolerance.  Pt tolerated new exercises without increase in pain. Pt progressing towards established goals.    Pt will benefit from skilled therapeutic intervention in order to improve on the following deficits Postural dysfunction;Improper body mechanics;Pain;Decreased range of motion;Decreased mobility;Increased fascial restricitons;Decreased endurance;Decreased activity tolerance   Rehab Potential Good   PT Frequency 2x / week   PT Duration 6 weeks   PT Treatment/Interventions Patient/family education;ADLs/Self Care Home Management;Manual techniques;Dry needling;Neuromuscular re-education;Cryotherapy;Electrical Stimulation;Moist Heat;Ultrasound;Therapeutic exercise;Therapeutic activities   PT Next Visit Plan continue postural education; stretching; core stabilization; manual work; modalities   Consulted and Agree with Plan of Care Patient        Problem List Patient Active Problem List   Diagnosis Date Noted  . Lumbosacral strain 02/23/2015  . Major depression in partial remission ([place]) 01/11/2015  . Generalized anxiety disorder 01/11/2015  Kerin Perna, PTA 03/10/2015 12:40 PM  [place], [place], [address] Phone: [phone]   Fax:  [phone]  Name: Dawn Moreno MRN: [id number] Date of Birth: 11-11-1980

## 2015-03-11 LAB — CYTOLOGY - PAP

## 2015-03-14 ENCOUNTER — Ambulatory Visit (INDEPENDENT_AMBULATORY_CARE_PROVIDER_SITE_OTHER): Payer: BLUE CROSS/BLUE SHIELD

## 2015-03-14 ENCOUNTER — Encounter: Payer: Self-pay | Admitting: Family Medicine

## 2015-03-14 ENCOUNTER — Ambulatory Visit (INDEPENDENT_AMBULATORY_CARE_PROVIDER_SITE_OTHER): Payer: BLUE CROSS/BLUE SHIELD | Admitting: Family Medicine

## 2015-03-14 ENCOUNTER — Ambulatory Visit (INDEPENDENT_AMBULATORY_CARE_PROVIDER_SITE_OTHER): Payer: BLUE CROSS/BLUE SHIELD | Admitting: Physical Therapy

## 2015-03-14 VITALS — BP 131/78 | HR 87 | Wt 160.0 lb

## 2015-03-14 DIAGNOSIS — M545 Low back pain, unspecified: Secondary | ICD-10-CM

## 2015-03-14 DIAGNOSIS — Z7409 Other reduced mobility: Secondary | ICD-10-CM

## 2015-03-14 DIAGNOSIS — M623 Immobility syndrome (paraplegic): Secondary | ICD-10-CM | POA: Diagnosis not present

## 2015-03-14 DIAGNOSIS — M546 Pain in thoracic spine: Secondary | ICD-10-CM

## 2015-03-14 DIAGNOSIS — M2560 Stiffness of unspecified joint, not elsewhere classified: Secondary | ICD-10-CM

## 2015-03-14 MED ORDER — TRAMADOL-ACETAMINOPHEN 37.5-325 MG PO TABS: 1.0000 | ORAL_TABLET | Freq: Three times a day (TID) | ORAL | Status: AC | PRN

## 2015-03-14 NOTE — Therapy (Signed)
[place], [place], [address] Phone: [phone]   Fax:  [phone]  Physical Therapy Treatment  Patient Details  Name: Dawn Moreno MRN: [id number] Date of Birth: April 14, 1981 Referring Provider: Dr. Sherene Sires  Encounter Date: 03/14/2015      PT End of Session - 03/14/15 1140    Visit Number 5   Number of Visits 12   Date for PT Re-Evaluation 04/08/15   PT Start Time 7341   PT Stop Time 1215   PT Time Calculation (min) 40 min   Activity Tolerance Patient tolerated treatment well;No increased pain      Past Medical History  Diagnosis Date  . Anxiety and depression   . Depression   . ADHD (attention deficit hyperactivity disorder)     Past Surgical History  Procedure Laterality Date  . Wisdom tooth extraction      There were no vitals filed for this visit.  Visit Diagnosis:  Bilateral low back pain without sciatica  Stiffness due to immobility  Impaired functional mobility and endurance      Subjective Assessment - 03/14/15 1140    Subjective Pt reports she had a lot of pain over weekend that she feels is from uncomfortable chairs at restaurants.  She is also attributing some muscle pain to medication changes.    Currently in Pain? No/denies            [facility] PT Assessment - 03/14/15 0001    Assessment   Medical Diagnosis lumbosacral strain    Referring Provider Dr. Sherene Sires   Onset Date/Surgical Date 02/20/15   Hand Dominance Right   Next MD Visit PRN          [facility] Adult PT Treatment/Exercise - 03/14/15 0001    Lumbar Exercises: Stretches   Passive Hamstring Stretch 2 reps;30 seconds  each leg with strap   Press Ups 5 reps;10 seconds   ITB Stretch 2 reps  each leg with strap   ITB Stretch Limitations (also adductor stretch with strap x 30 sec each leg)   Lumbar Exercises: Aerobic   Stationary Bike NuStep L4: 7 min (with arms / legs)   Lumbar Exercises:  Supine   Clam 10 reps;1 second  each leg, with ab set   Heel Slides 10 reps  each leg, with ab set   Other Supine Lumbar Exercises Pilates table top with heel taps x 10 reps. (challenging)   Lumbar Exercises: Sidelying   Other Sidelying Lumbar Exercises Thoracic rotation with arm in T x 5 reps, Y x 5 reps each side.  Repeated with yellow band    Lumbar Exercises: Prone   Other Prone Lumbar Exercises childs pose x 20 sec; with lateral trunk flexion x 20 sec x 2 reps   Other Prone Lumbar Exercises Modified plank with hands on black low mat x 15 sec x 2 reps, modified downward dog x 15 sec x 2 reps.   Modified high plank with knees on black mat table x 15 sec, followed by regular downward dog x 15 sec (no pain)   Lumbar Exercises: Quadruped   Opposite Arm/Leg Raise Right arm/Left leg;Left arm/Right leg;10 reps           PT Education - 03/14/15 1220    Education provided Yes   Education Details HEP- pt to perform opp arm/leg exercise x 10 reps, held 2 secs each rep .   Person(s) Educated Patient   Methods Explanation   Comprehension  Returned demonstration;Verbalized understanding             PT Long Term Goals - 03/14/15 1155    PT LONG TERM GOAL #1   Title Patient to demonstrate improved posture and alignment 04/08/15   Time 6   Period Weeks   Status Achieved   PT LONG TERM GOAL #2   Title Improve spinal extension to 75-80% with prone press up 03/18/15   Time 3   Period Weeks   Status On-going   PT LONG TERM GOAL #3   Title Improve hamstring flexibility to 80 to 85 deg bilat 04/08/15   Time 6   Period Weeks   Status On-going   PT LONG TERM GOAL #4   Title Patient to tolerate sitting; standing; walking for 20-30 min 04/08/15   Time 6   Period Weeks   Status Partially Met   PT LONG TERM GOAL #5   Title Improve FOTO to </= 35% limitation 04/08/15   Time 6   Period Weeks   Status On-going               Plan - 03/14/15 1154    Clinical Impression  Statement Pt able to tolerate all exercises without production of pain in low back; held modalities due to lack of pain today. Pt continues with hamstring tightness bilat, as well as hip flexors. Pt reporting decreased overall pain.  Progressing well towards established goals.    Pt will benefit from skilled therapeutic intervention in order to improve on the following deficits Postural dysfunction;Improper body mechanics;Pain;Decreased range of motion;Decreased mobility;Increased fascial restricitons;Decreased endurance;Decreased activity tolerance   Rehab Potential Good   PT Frequency 2x / week   PT Duration 6 weeks   PT Treatment/Interventions Patient/family education;ADLs/Self Care Home Management;Manual techniques;Dry needling;Neuromuscular re-education;Cryotherapy;Electrical Stimulation;Moist Heat;Ultrasound;Therapeutic exercise;Therapeutic activities   PT Next Visit Plan continue postural education; stretching; core stabilization; manual work; modalities as needed    Consulted and Agree with Plan of Care Patient        Problem List Patient Active Problem List   Diagnosis Date Noted  . Thoracolumbar back pain 02/23/2015  . Major depression in partial remission ([place]) 01/11/2015  . Generalized anxiety disorder 01/11/2015   Kerin Perna, PTA 03/14/2015 12:24 PM  [place], [place], [address] Phone: [phone]   Fax:  [phone]  Name: Dawn Moreno MRN: [id number] Date of Birth: 1980-07-12

## 2015-03-14 NOTE — Assessment & Plan Note (Signed)
Not improved or worsened. Check thoracic and lumbar x-rays. Additionally check CBC CK CRP and ESR. Continue physical therapy. If labs and x-rays are normal and not better in 2 or 3 weeks we'll obtain an MRI

## 2015-03-14 NOTE — Progress Notes (Signed)
Dawn Moreno is a 34 y.o. female who presents to [facility]  today for follow-up back pain. Patient was seen about a month ago for back pain. She was thought to have strain and was sent to physical therapy. She's been multiple times and feels somewhat better but not all the way better. Her pain is severe at times still. She states the naproxen and methocarbamol have not been helpful but tramadol is. She would like a refill of tramadol. She denies any injury. She notes that her pain worsened when she increased her dose of Abilify. She is down taking 5 mg at a time now and feels as her pain is slightly improved. The pain is located predominantly at the very low portion of her thoracic spine and high portion of her lumbar spine.  Past Medical History  Diagnosis Date  . Anxiety and depression   . Depression   . ADHD (attention deficit hyperactivity disorder)    Past Surgical History  Procedure Laterality Date  . Wisdom tooth extraction     Social History  Substance Use Topics  . Smoking status: Never Smoker   . Smokeless tobacco: Never Used  . Alcohol Use: 0.0 oz/week    0 Standard drinks or equivalent per week     Comment: occassional   family history includes Depression in her father; Diabetes in her father and maternal aunt; Lung cancer in her maternal grandmother; Multiple myeloma in her mother.  ROS as above Medications: Current Outpatient Prescriptions  Medication Sig Dispense Refill  . ARIPiprazole (ABILIFY) 10 MG tablet Take 10 mg by mouth daily.    Marland Kitchen buPROPion (WELLBUTRIN XL) 150 MG 24 hr tablet Take 1 tablet (150 mg total) by mouth daily. 30 tablet 0  . clonazePAM (KLONOPIN) 0.5 MG tablet Take 1 tablet (0.5 mg total) by mouth daily. 30 tablet 0  . fluticasone (FLONASE) 50 MCG/ACT nasal spray Place 2 sprays into both nostrils daily. 16 g 3  . L-Methylfolate (DEPLIN PO) Take 14 mg by mouth.    . Norethindrone-Ethinyl Estradiol-Fe Biphas (LO  LOESTRIN FE) 1 MG-10 MCG / 10 MCG tablet Take 1 tablet by mouth daily. 1 Package 16  . traMADol-acetaminophen (ULTRACET) 37.5-325 MG tablet Take 1 tablet by mouth every 8 (eight) hours as needed. 30 tablet 0   No current facility-administered medications for this visit.   No Known Allergies   Exam:  BP 131/78 mmHg  Pulse 87  Wt 160 lb (72.576 kg)  LMP 02/16/2015 Gen: Well NAD HEENT: EOMI,  MMM Lungs: Normal work of breathing. CTABL Heart: RRR no MRG Abd: NABS, Soft. Nondistended, Nontender Exts: Brisk capillary refill, warm and well perfused.  MSK: Nontender to spinal midline. Tender palpation right thoracic and lumbar paraspinals. Back motion is normal. Normal gait. Extremity strength is intact bilaterally  No results found for this or any previous visit (from the past 24 hour(s)). No results found.   Please see individual assessment and plan sections.

## 2015-03-14 NOTE — Patient Instructions (Signed)
Thank you for coming in today. Get xrays today.  Continue PT.  Call if not improving in a few weeks and we will order a MRI.  Get labs today.  Come back or go to the emergency room if you notice new weakness new numbness problems walking or bowel or bladder problems.

## 2015-03-15 LAB — CBC
HEMATOCRIT: 41.8 % (ref 36.0–46.0)
Hemoglobin: 14.4 g/dL (ref 12.0–15.0)
MCH: 30.8 pg (ref 26.0–34.0)
MCHC: 34.4 g/dL (ref 30.0–36.0)
MCV: 89.3 fL (ref 78.0–100.0)
MPV: 9 fL (ref 8.6–12.4)
Platelets: 312 10*3/uL (ref 150–400)
RBC: 4.68 MIL/uL (ref 3.87–5.11)
RDW: 12.8 % (ref 11.5–15.5)
WBC: 7 10*3/uL (ref 4.0–10.5)

## 2015-03-15 LAB — C-REACTIVE PROTEIN: CRP: 0.5 mg/dL (ref <0.60)

## 2015-03-15 LAB — SEDIMENTATION RATE: SED RATE: 5 mm/h (ref 0–20)

## 2015-03-15 LAB — CK: CK TOTAL: 44 U/L (ref 7–177)

## 2015-03-15 NOTE — Progress Notes (Signed)
Quick Note:  Xrays were normal ______

## 2015-03-15 NOTE — Progress Notes (Signed)
Quick Note:    Labs are fine.  ______

## 2015-03-17 ENCOUNTER — Telehealth: Payer: Self-pay

## 2015-03-17 ENCOUNTER — Ambulatory Visit (INDEPENDENT_AMBULATORY_CARE_PROVIDER_SITE_OTHER): Payer: BLUE CROSS/BLUE SHIELD | Admitting: Physical Therapy

## 2015-03-17 DIAGNOSIS — M545 Low back pain, unspecified: Secondary | ICD-10-CM

## 2015-03-17 DIAGNOSIS — Z7409 Other reduced mobility: Secondary | ICD-10-CM

## 2015-03-17 DIAGNOSIS — M2560 Stiffness of unspecified joint, not elsewhere classified: Secondary | ICD-10-CM

## 2015-03-17 DIAGNOSIS — M623 Immobility syndrome (paraplegic): Secondary | ICD-10-CM | POA: Diagnosis not present

## 2015-03-17 NOTE — Telephone Encounter (Signed)
Yes please reduce pain medicines as needed.

## 2015-03-17 NOTE — Telephone Encounter (Signed)
Patient called and wanted to know if it is ok to reduce the naproxen to once daily now that her back is doing better.

## 2015-03-17 NOTE — Therapy (Signed)
[place], [place], [address] Phone: [phone]   Fax:  [phone]  Physical Therapy Treatment  Patient Details  Name: Dawn Moreno MRN: [id number] Date of Birth: 05-21-1980 Referring Provider: Dr. Sherene Sires  Encounter Date: 03/17/2015      PT End of Session - 03/17/15 1154    Visit Number 6   Number of Visits 12   Date for PT Re-Evaluation 04/08/15   PT Start Time 1147   PT Stop Time 2800   PT Time Calculation (min) 48 min   Activity Tolerance Patient tolerated treatment well;No increased pain      Past Medical History  Diagnosis Date  . Anxiety and depression   . Depression   . ADHD (attention deficit hyperactivity disorder)     Past Surgical History  Procedure Laterality Date  . Wisdom tooth extraction      There were no vitals filed for this visit.  Visit Diagnosis:  Bilateral low back pain without sciatica  Stiffness due to immobility  Impaired functional mobility and endurance      Subjective Assessment - 03/17/15 1155    Subjective Pt reports she was able to sit for 4 hours without any pain (but did take pain medicine,too).  She was able to stand/walk at [facility] for 45 min without any pain.  She's wanting to weaning off of naproxen.    Currently in Pain? No/denies            [facility] PT Assessment - 03/17/15 0001    Assessment   Medical Diagnosis lumbosacral strain    Onset Date/Surgical Date 02/20/15   Hand Dominance Right   Next MD Visit PRN          [facility] Adult PT Treatment/Exercise - 03/17/15 0001    Lumbar Exercises: Stretches   Passive Hamstring Stretch 2 reps;30 seconds  each leg. Seated.    Passive Hamstring Stretch Limitations VC for straight back.    Press Ups --  10 reps, 2-3 sec   Piriformis Stretch 2 reps;30 seconds  seated, knee towards opp shoulder   Lumbar Exercises: Aerobic   Stationary Bike NuStep L5: 7 min (with arms / legs)   Lumbar  Exercises: Standing   Other Standing Lumbar Exercises Doorway stretch 3 positions x 1 rep each level, Modified triangle pose x 20 sec x 2 reps each side.    Other Standing Lumbar Exercises Woodchop with red x 5, switched to green x 5 reps each side.   Anti-rotation arms at center to out 90 deg flexion with red band x 10   Lumbar Exercises: Supine   Other Supine Lumbar Exercises Pilates leg circles small to/from large with core engaged.    Lumbar Exercises: Prone   Opposite Arm/Leg Raise Right arm/Left leg;Left arm/Right leg;10 reps   Other Prone Lumbar Exercises childs pose x 20 sec; with lateral trunk flexion x 20 sec x 2 reps   Lumbar Exercises: Quadruped   Opposite Arm/Leg Raise Right arm/Left leg;Left arm/Right leg;5 reps;5 seconds  2 sets   Knee/Hip Exercises: Stretches   Gastroc Stretch Right;Left;2 reps;30 seconds   Soleus Stretch Right;Left;2 reps;30 seconds   Modalities   Modalities --  held due to 0/10 pain           PT Long Term Goals - 03/17/15 1233    PT LONG TERM GOAL #1   Title Patient to demonstrate improved posture and alignment 04/08/15   Time 6   Period Weeks  Status Achieved   PT LONG TERM GOAL #2   Title Improve spinal extension to 75-80% with prone press up 03/18/15   Time 3   Period Weeks   Status Achieved   PT LONG TERM GOAL #4   Title Patient to tolerate sitting; standing; walking for 20-30 min 04/08/15   Time 6   Period Weeks   Status Partially Met   PT LONG TERM GOAL #5   Title Improve FOTO to </= 35% limitation 04/08/15   Time 6   Period Weeks   Status On-going               Plan - 03/17/15 1239    Clinical Impression Statement Pt tolerated new exercises without production of pain in back. Pt demonstrated improved spinal extension with press ups; has met LTG #2.  Pt progressing well towards remaining goals.    Pt will benefit from skilled therapeutic intervention in order to improve on the following deficits Postural  dysfunction;Improper body mechanics;Pain;Decreased range of motion;Decreased mobility;Increased fascial restricitons;Decreased endurance;Decreased activity tolerance   Rehab Potential Good   PT Frequency 2x / week   PT Duration 6 weeks   PT Treatment/Interventions Patient/family education;ADLs/Self Care Home Management;Manual techniques;Dry needling;Neuromuscular re-education;Cryotherapy;Electrical Stimulation;Moist Heat;Ultrasound;Therapeutic exercise;Therapeutic activities   PT Next Visit Plan Finalize HEP;  assess goals and need for additional sessions vs d/c to HEP.    Consulted and Agree with Plan of Care Patient        Problem List Patient Active Problem List   Diagnosis Date Noted  . Thoracolumbar back pain 02/23/2015  . Major depression in partial remission ([place]) 01/11/2015  . Generalized anxiety disorder 01/11/2015    Kerin Perna, PTA 03/17/2015 12:42 PM  [place], [place], [address] Phone: [phone]   Fax:  [phone]  Name: Dawn Moreno MRN: [id number] Date of Birth: [birth year]

## 2015-03-17 NOTE — Patient Instructions (Signed)
Wood Chop: Sitting (Cable)    Grasp cable and rotate trunk by bringing hands toward opposite hip. Keep pelvis stable. Repeat to other side. Do _1-2___ sets. Complete __10__ repetitions. Red or green band  http://sb.exer.us/25   Copyright  VHI. All rights reserved.   [facility] [address] [place], [place] [address]  [phone] (office) [phone] (fax)

## 2015-03-18 NOTE — Telephone Encounter (Signed)
Patient advised.

## 2015-03-21 ENCOUNTER — Ambulatory Visit (INDEPENDENT_AMBULATORY_CARE_PROVIDER_SITE_OTHER): Payer: BLUE CROSS/BLUE SHIELD | Admitting: Physical Therapy

## 2015-03-21 DIAGNOSIS — Z7409 Other reduced mobility: Secondary | ICD-10-CM

## 2015-03-21 DIAGNOSIS — M2560 Stiffness of unspecified joint, not elsewhere classified: Secondary | ICD-10-CM

## 2015-03-21 DIAGNOSIS — M623 Immobility syndrome (paraplegic): Secondary | ICD-10-CM

## 2015-03-21 NOTE — Patient Instructions (Signed)
Plank / Walking The Mutual of OmahaPlank Pose    Hips and arms completely stable, exhale and very lightly touch knee to floor. Inhale return to plank pose. Exhale other knee very lightly to floor. Inhale, return to plank pose.  (Can do single knee or double knee) Repeat __10__ times each leg.   Plank to Downward Dog (Floor)    From table, step to plank. Hold steady, keep base of skull rising, arms and abdominals strong, and pelvic tilt engaged. Hold for _5___ breaths. Reach hips up and back into downward dog. Push through hands, lengthen spine. May bend knees to keep spine straight. Hold for _5___ breaths. Repeat _3-5___ times.  Can perform hip twists from plank on elbows. (4 reps, rest into press up position and repeat) Copyright  VHI. All rights reserved.   [facility] [address] [place], [place] [address]  [phone] (office) [phone] (fax)

## 2015-03-21 NOTE — Therapy (Addendum)
[facility] [address] [address] [place], [place], [address] Phone: [phone]   Fax:  [phone]  Physical Therapy Treatment  Patient Details  Name: Dawn Moreno MRN: [id number] Date of Birth: 06-May-1981 Referring Provider: Dr. Sherene Sires   Encounter Date: 03/21/2015      PT End of Session - 03/21/15 1153    Visit Number 7   Number of Visits 12   Date for PT Re-Evaluation 04/08/15   PT Start Time 3419   PT Stop Time 1227   PT Time Calculation (min) 42 min   Activity Tolerance Patient tolerated treatment well      Past Medical History  Diagnosis Date  . Anxiety and depression   . Depression   . ADHD (attention deficit hyperactivity disorder)     Past Surgical History  Procedure Laterality Date  . Wisdom tooth extraction      There were no vitals filed for this visit.  Visit Diagnosis:  Bilateral low back pain without sciatica  Stiffness due to immobility  Impaired functional mobility and endurance      Subjective Assessment - 03/21/15 1153    Subjective Pt reports she is no longer taking any muscle relaxers or naproxen and is still painfree.  Pt reports she walked about an hour on concrete and had 2/10 pain in back; resolved with rest.             [facility] PT Assessment - 03/21/15 0001    Assessment   Medical Diagnosis lumbosacral strain    Referring Provider Dr. Sherene Sires    Onset Date/Surgical Date 02/20/15   Hand Dominance Right   Next MD Visit PRN   Observation/Other Assessments   Focus on Therapeutic Outcomes (FOTO)  24% limited    AROM   Lumbar Flexion WNL   Lumbar Extension WNL   Lumbar - Right Side Bend WNL   Lumbar - Left Side Bend WNL   Lumbar - Right Rotation WNL   Lumbar - Left Rotation WNL   Flexibility   Hamstrings Lt 59 deg, Rt 60 deg          [facility] Adult PT Treatment/Exercise - 03/21/15 0001    Lumbar Exercises: Stretches   Passive Hamstring Stretch 2 reps;60 seconds   each leg. Seated.    Passive Hamstring Stretch Limitations VC for straight back.    Piriformis Stretch 2 reps;30 seconds  seated, knee towards opp shoulder   Lumbar Exercises: Aerobic   Stationary Bike NuStep L5: 5 min (with arms / legs)   Elliptical L2.5: 4 min    Lumbar Exercises: Standing   Other Standing Lumbar Exercises Doorway stretch 3 positions x 1 rep each level, Modified triangle pose x 20 sec x 2 reps each side.    Other Standing Lumbar Exercises [place] with green x 10 reps each side.   Anti-rotation arms at center to out 90 deg flexion with red band x 5 reps, switched to green band    Lumbar Exercises: Prone   Other Prone Lumbar Exercises low plank with hip twists x 4 reps x 3 sets   Other Prone Lumbar Exercises High plank to knees touching x 5 reps x 2 sets   Knee/Hip Exercises: Stretches   Passive Hamstring Stretch Right;Left;2 reps;30 seconds   Quad Stretch Right;Left;2 reps;30 seconds   Other Knee/Hip Stretches ITB and adductor stretch with strap x 30 sec x 2 reps each leg           PT Long  Term Goals - 03/21/15 1223    PT LONG TERM GOAL #1   Title Patient to demonstrate improved posture and alignment 04/08/15   Time 6   Period Weeks   Status Achieved   PT LONG TERM GOAL #2   Title Improve spinal extension to 75-80% with prone press up 03/18/15   Time 3   Period Weeks   Status Achieved   PT LONG TERM GOAL #3   Title Improve hamstring flexibility to 80 to 85 deg bilat 04/08/15   Time 6   Period Weeks   Status Not Met   PT LONG TERM GOAL #4   Title Patient to tolerate sitting; standing; walking for 20-30 min 04/08/15   Time 6   Period Weeks   Status Achieved   PT LONG TERM GOAL #5   Title Improve FOTO to </= 35% limitation 04/08/15   Time 6   Period Weeks   Status Achieved               Plan - 03/21/15 1231    Clinical Impression Statement Pt tolerated new exercises for core strengthening without production of back pain. Pt continues with  tight hamstrings despite stretching efforts; goal unmet.  Pt has met all other goals and is satisfied with current level of function.  Pt requests to d/c to HEP.    Pt will benefit from skilled therapeutic intervention in order to improve on the following deficits Postural dysfunction;Improper body mechanics;Pain;Decreased range of motion;Decreased mobility;Increased fascial restricitons;Decreased endurance;Decreased activity tolerance   Rehab Potential Good   PT Frequency 2x / week   PT Duration 6 weeks   PT Treatment/Interventions Patient/family education;ADLs/Self Care Home Management;Manual techniques;Dry needling;Neuromuscular re-education;Cryotherapy;Electrical Stimulation;Moist Heat;Ultrasound;Therapeutic exercise;Therapeutic activities   PT Next Visit Plan Spoke to supervising PT; will d/c to HEP at this time.    Consulted and Agree with Plan of Care Patient        Problem List Patient Active Problem List   Diagnosis Date Noted  . Thoracolumbar back pain 02/23/2015  . Major depression in partial remission ([place]) 01/11/2015  . Generalized anxiety disorder 01/11/2015   Kerin Perna, PTA 03/21/2015 12:35 PM  [facility] [address] [place], [place], [address] Phone: [phone]   Fax:  [phone]  Name: Dawn Moreno MRN: [id number] Date of Birth: March 12, 1981  PHYSICAL THERAPY DISCHARGE SUMMARY  Visits from Start of Care:7  Current functional level related to goals / functional outcomes: Pain free and returned to normal functional activities   Remaining deficits: Intermittent symptoms    Education / Equipment: HEP/theraband  Plan: Patient agrees to discharge.  Patient goals were partially met. Patient is being discharged due to being pleased with the current functional level.  ?????    Celyn P. Helene Kelp PT, MPH 03/28/2015 9:06 AM

## 2015-03-24 ENCOUNTER — Encounter: Payer: Self-pay | Admitting: Physical Therapy

## 2015-05-10 ENCOUNTER — Other Ambulatory Visit: Payer: Self-pay | Admitting: Osteopathic Medicine

## 2015-05-16 ENCOUNTER — Ambulatory Visit (INDEPENDENT_AMBULATORY_CARE_PROVIDER_SITE_OTHER): Payer: BLUE CROSS/BLUE SHIELD | Admitting: Osteopathic Medicine

## 2015-05-16 ENCOUNTER — Encounter: Payer: Self-pay | Admitting: Osteopathic Medicine

## 2015-05-16 VITALS — BP 125/75 | HR 93 | Temp 98.1°F | Ht 68.0 in | Wt 165.0 lb

## 2015-05-16 DIAGNOSIS — J029 Acute pharyngitis, unspecified: Secondary | ICD-10-CM

## 2015-05-16 DIAGNOSIS — J0120 Acute ethmoidal sinusitis, unspecified: Secondary | ICD-10-CM | POA: Diagnosis not present

## 2015-05-16 LAB — POCT RAPID STREP A (OFFICE): Rapid Strep A Screen: NEGATIVE

## 2015-05-16 MED ORDER — AMOXICILLIN-POT CLAVULANATE 875-125 MG PO TABS: 1.0000 | ORAL_TABLET | Freq: Two times a day (BID) | ORAL | Status: DC

## 2015-05-16 NOTE — Progress Notes (Signed)
HPI: Dawn Moreno is a 35 y.o. female who presents to [facility]  today for chief complaint of:  Chief Complaint  Patient presents with  . Sore Throat   . Location: throat, sinus congestion x 2 weeks . Quality: soreness, scratchy throat  . Severity: marked . Duration: see above days . Modifying factors: has tried the following OTC medications: Tylenol and Sudafed, Floanse helps as well  with relief  MODIFIED CENTOR CRITERIA (ponts if "yes"): Tonsillar exudate (1): no Tender Ant Cervical LN (1): no Absence of cough (1): no Fever (1): no Age  33-14 (1): no 15-45 (0): yes >/= 45 (-1): no SCORE: 0   Past medical, social and family history reviewed: Past Medical History  Diagnosis Date  . Anxiety and depression   . Depression   . ADHD (attention deficit hyperactivity disorder)    Past Surgical History  Procedure Laterality Date  . Wisdom tooth extraction     Social History  Substance Use Topics  . Smoking status: Never Smoker   . Smokeless tobacco: Never Used  . Alcohol Use: 0.0 oz/week    0 Standard drinks or equivalent per week     Comment: occassional   Family History  Problem Relation Age of Onset  . Depression Father   . Diabetes Father   . Multiple myeloma Mother   . Diabetes Maternal Aunt   . Lung cancer Maternal Grandmother     Current Outpatient Prescriptions  Medication Sig Dispense Refill  . ARIPiprazole (ABILIFY) 10 MG tablet Take 10 mg by mouth daily.    Marland Kitchen buPROPion (WELLBUTRIN XL) 150 MG 24 hr tablet Take 1 tablet (150 mg total) by mouth daily. 30 tablet 0  . clonazePAM (KLONOPIN) 0.5 MG tablet Take 1 tablet (0.5 mg total) by mouth daily. 30 tablet 0  . fluticasone (FLONASE) 50 MCG/ACT nasal spray PLACE 2 SPRAYS INTO BOTH NOSTRILS DAILY. 16 g 3  . L-Methylfolate (DEPLIN PO) Take 14 mg by mouth.    . Norethindrone-Ethinyl Estradiol-Fe Biphas (LO LOESTRIN FE) 1 MG-10 MCG / 10 MCG tablet Take 1 tablet by mouth daily.  1 Package 16  . traMADol-acetaminophen (ULTRACET) 37.5-325 MG tablet Take 1 tablet by mouth every 8 (eight) hours as needed. 30 tablet 0   No current facility-administered medications for this visit.   No Known Allergies    Review of Systems: CONSTITUTIONAL: no fever/chills HEAD/EYES/EARS/NOSE/THROAT: yes headache, no vision change or hearing change, yes sore throat CARDIAC: No chest pain/pressure/palpitations, no orthopnea RESPIRATORY: mild cough, no shortness of breath GASTROINTESTINAL: no nausea, no vomiting, no abdominal pain/blood in stool/diarrhea/constipation MUSCULOSKELETAL: no myalgia/arthralgia   Exam:  BP 125/75 mmHg  Pulse 93  Temp(Src) 98.1 F (36.7 C) (Oral)  Ht 5' 8"  (1.727 m)  Wt 165 lb (74.844 kg)  BMI 25.09 kg/m2  Constitutional: VSS, see above. General Appearance: alert, well-developed, well-nourished, NAD Eyes: Normal lids and conjunctive, non-icteric sclera, PERRLA Ears, Nose, Mouth, Throat: Normal external inspection ears/nares/mouth/lips/gums, (+)cerumen bilaterally TM, MMM; posterior pharynx without erythema, without exudate Neck: No masses, trachea midline. No thyroid enlargement/tenderness/mass appreciated, normal lymph nodes Respiratory: Normal respiratory effort. No  wheeze/rhonchi/rales Cardiovascular: S1/S2 normal, no murmur/rub/gallop auscultated. RRR. No carotid bruit or JVD. No lower extremity edema.  Results for orders placed or performed in visit on 05/16/15 (from the past 24 hour(s))  POCT rapid strep A     Status: None   Collection Time: 05/16/15  1:18 PM  Result Value Ref Range   Rapid Strep  A Screen Negative Negative     ASSESSMENT/PLAN:  Acute ethmoidal sinusitis, recurrence not specified - Plan: amoxicillin-clavulanate (AUGMENTIN) 875-125 MG tablet  Sore throat - Plan: POCT rapid strep A    Return if symptoms worsen or fail to improve.

## 2015-05-16 NOTE — Patient Instructions (Signed)
If no better in 5 - 7 days, call the office and we may need to change antibiotics. In case you are still not feeling better after that, we may need to get a CT of the sinuses to rule out causes of recurrent or persistent infection, but if your symptoms resolve on treatment then we should not need to get imaging. Continue Tylenol, Sudafed and Flonase, you can also add an antihistamine such as Benadryl or Claritin or similar.

## 2015-06-09 ENCOUNTER — Other Ambulatory Visit: Payer: Self-pay | Admitting: *Deleted

## 2015-06-09 DIAGNOSIS — Z3041 Encounter for surveillance of contraceptive pills: Secondary | ICD-10-CM

## 2015-06-09 MED ORDER — NORETHIN-ETH ESTRAD-FE BIPHAS 1 MG-10 MCG / 10 MCG PO TABS: 1.0000 | ORAL_TABLET | Freq: Every day | ORAL | Status: AC

## 2015-06-09 NOTE — Telephone Encounter (Signed)
RF request for LoEstrin FE sent to [facility].  Pt was last seen 11/16 by Dr Marice Potter.

## 2016-02-08 ENCOUNTER — Other Ambulatory Visit: Payer: Self-pay | Admitting: Osteopathic Medicine

## 2016-02-28 ENCOUNTER — Encounter: Payer: Self-pay | Admitting: Sports Medicine

## 2016-02-28 ENCOUNTER — Ambulatory Visit (INDEPENDENT_AMBULATORY_CARE_PROVIDER_SITE_OTHER): Payer: BLUE CROSS/BLUE SHIELD | Admitting: Sports Medicine

## 2016-02-28 DIAGNOSIS — H6123 Impacted cerumen, bilateral: Secondary | ICD-10-CM | POA: Diagnosis not present

## 2016-02-28 NOTE — Progress Notes (Signed)
  Subjective:    CC: Ear fullness  HPI: This is a pleasant 35 year old female, she's had several days of increasing and worsening fullness in her ears with difficulty hearing, mild instability and being off balance, no constitutional symptoms, mild runny nose, mild sore throat. Symptoms are mild, persistent.  Past medical history:  Negative.  See flowsheet/record as well for more information.  Surgical history: Negative.  See flowsheet/record as well for more information.  Family history: Negative.  See flowsheet/record as well for more information.  Social history: Negative.  See flowsheet/record as well for more information.  Allergies, and medications have been entered into the medical record, reviewed, and no changes needed.   Review of Systems: No fevers, chills, night sweats, weight loss, chest pain, or shortness of breath.   Objective:    General: Well Developed, well nourished, and in no acute distress.  Neuro: Alert and oriented x3, extra-ocular muscles intact, sensation grossly intact.  HEENT: Normocephalic, atraumatic, pupils equal round reactive to light, neck supple, no masses, no lymphadenopathy, thyroid nonpalpable. Oropharynx and nasopharynx are unremarkable, ear canals are occluded with cerumen Skin: Warm and dry, no rashes. Cardiac: Regular rate and rhythm, no murmurs rubs or gallops, no lower extremity edema.  Respiratory: Clear to auscultation bilaterally. Not using accessory muscles, speaking in full sentences.  Indication: Cerumen impaction of the left and right ear(s) Medical necessity statement: On physical examination, cerumen impairs clinically significant portions of the external auditory canal, and tympanic membrane. Noted obstructive, copious cerumen that cannot be removed without magnification and instrumentations requiring physician skills Consent: Discussed benefits and risks of procedure and verbal consent obtained Procedure: Patient was prepped for the  procedure. Utilized an otoscope to assess and take note of the ear canal, the tympanic membrane, and the presence, amount, and placement of the cerumen. Gentle water irrigation and soft plastic curette was utilized to remove cerumen. We needed to use curettage and irrigation for both canals. Post procedure examination: shows cerumen was completely removed. Patient tolerated procedure well. The patient is made aware that they may experience temporary vertigo, temporary hearing loss, and temporary discomfort. If these symptom last for more than 24 hours to call the clinic or proceed to the ED.  Impression and Recommendations:    Bilateral hearing loss due to cerumen impaction Bilateral curettage by physician. Irrigation was also necessary, I was unable to get all of the wax with a curet. Return as needed.

## 2016-02-28 NOTE — Assessment & Plan Note (Signed)
Bilateral curettage by physician. Irrigation was also necessary, I was unable to get all of the wax with a curet. Return as needed.

## 2016-03-05 ENCOUNTER — Other Ambulatory Visit: Payer: Self-pay | Admitting: Osteopathic Medicine

## 2016-03-14 ENCOUNTER — Ambulatory Visit (INDEPENDENT_AMBULATORY_CARE_PROVIDER_SITE_OTHER): Payer: BLUE CROSS/BLUE SHIELD | Admitting: Obstetrics & Gynecology

## 2016-03-14 ENCOUNTER — Encounter: Payer: Self-pay | Admitting: Obstetrics & Gynecology

## 2016-03-14 VITALS — BP 136/82 | HR 97 | Ht 68.0 in | Wt 155.0 lb

## 2016-03-14 DIAGNOSIS — Z23 Encounter for immunization: Secondary | ICD-10-CM

## 2016-03-14 DIAGNOSIS — Z01419 Encounter for gynecological examination (general) (routine) without abnormal findings: Secondary | ICD-10-CM | POA: Diagnosis not present

## 2016-03-14 MED ORDER — MISOPROSTOL 200 MCG PO TABS: ORAL_TABLET | ORAL | 0 refills | Status: AC

## 2016-03-14 NOTE — Progress Notes (Signed)
Subjective:    Dawn Moreno is a 35 y.o. MW G0 female who presents for an annual exam. The patient has no complaints today. The patient is sexually active. GYN screening history: last pap: was normal. The patient wears seatbelts: yes. The patient participates in regular exercise: no. Has the patient ever been transfused or tattooed?: no. The patient reports that there is not domestic violence in her life.   Menstrual History: OB History    Gravida Para Term Preterm AB Living   0 0 0 0 0 0   SAB TAB Ectopic Multiple Live Births   0 0 0 0        Menarche age: [age] Patient's last menstrual period was 02/11/2016.    The following portions of the patient's history were reviewed and updated as appropriate: allergies, current medications, past family history, past medical history, past social history, past surgical history and problem list.  Review of Systems Pertinent items are noted in HPI.   Married 6 1/2 years No FH breast/gyn/colon cancer Uses condoms also, starting period tomorrow    Objective:    BP 136/82   Pulse 97   Ht 5\' 8"  (1.727 m)   Wt 155 lb (70.3 kg)   LMP 02/11/2016   BMI 23.57 kg/m   General Appearance:    Alert, cooperative, no distress, appears stated age  Head:    Normocephalic, without obvious abnormality, atraumatic  Eyes:    PERRL, conjunctiva/corneas clear, EOM's intact, fundi    benign, both eyes  Ears:    Normal TM's and external ear canals, both ears  Nose:   Nares normal, septum midline, mucosa normal, no drainage    or sinus tenderness  Throat:   Lips, mucosa, and tongue normal; teeth and gums normal  Neck:   Supple, symmetrical, trachea midline, no adenopathy;    thyroid:  no enlargement/tenderness/nodules; no carotid   bruit or JVD  Back:     Symmetric, no curvature, ROM normal, no CVA tenderness  Lungs:     Clear to auscultation bilaterally, respirations unlabored  Chest Wall:    No tenderness or deformity   Heart:    Regular rate and rhythm,  S1 and S2 normal, no murmur, rub   or gallop  Breast Exam:    No tenderness, masses, or nipple abnormality  Abdomen:     Soft, non-tender, bowel sounds active all four quadrants,    no masses, no organomegaly  Genitalia:    Normal female without lesion, discharge or tenderness, NSSA, NT, mobile, no adnexal masses     Extremities:   Extremities normal, atraumatic, no cyanosis or edema  Pulses:   2+ and symmetric all extremities  Skin:   Skin color, texture, turgor normal, no rashes or lesions  Lymph nodes:   Cervical, supraclavicular, and axillary nodes normal  Neurologic:   CNII-XII intact, normal strength, sensation and reflexes    throughout   .    Assessment:    Healthy female exam.    Plan:     [facility] due to Provigil's side effect of decreasing effectiveness of OCPs   Pretreat with cytotec, come in next Tuesday

## 2016-03-15 ENCOUNTER — Telehealth: Payer: Self-pay

## 2016-03-15 LAB — VITAMIN D 25 HYDROXY (VIT D DEFICIENCY, FRACTURES): Vit D, 25-Hydroxy: 29 ng/mL — ABNORMAL LOW (ref 30–100)

## 2016-03-15 MED ORDER — VITAMIN D (ERGOCALCIFEROL) 1.25 MG (50000 UNIT) PO CAPS: 50000.0000 [IU] | ORAL_CAPSULE | ORAL | 0 refills | Status: AC

## 2016-03-15 NOTE — Telephone Encounter (Signed)
Left a message on pt's voicemail to inform her that her vitamin d was low and that we had sent it to her pharmacy. I also told her to take it once a week for 8 weeks and then return to the office to have it rechecked.

## 2016-03-20 ENCOUNTER — Ambulatory Visit (INDEPENDENT_AMBULATORY_CARE_PROVIDER_SITE_OTHER): Payer: BLUE CROSS/BLUE SHIELD | Admitting: Obstetrics & Gynecology

## 2016-03-20 ENCOUNTER — Encounter: Payer: Self-pay | Admitting: Obstetrics & Gynecology

## 2016-03-20 VITALS — BP 81/55 | HR 60 | Ht 68.0 in | Wt 155.0 lb

## 2016-03-20 DIAGNOSIS — Z3202 Encounter for pregnancy test, result negative: Secondary | ICD-10-CM | POA: Diagnosis not present

## 2016-03-20 DIAGNOSIS — Z30430 Encounter for insertion of intrauterine contraceptive device: Secondary | ICD-10-CM

## 2016-03-20 LAB — POCT URINE PREGNANCY: Preg Test, Ur: NEGATIVE

## 2016-03-20 MED ORDER — LEVONORGESTREL 19.5 MG IU IUD
1.0000 | INTRAUTERINE_SYSTEM | Freq: Once | INTRAUTERINE | Status: AC
  Administered 2016-03-20: 1 via INTRAUTERINE

## 2016-03-20 NOTE — Progress Notes (Signed)
   Subjective:    Patient ID: Dawn Moreno, female    DOB: 01-Jan-1981, 35 y.o.   MRN: [id number]  HPI 35 yo MW G0 [facility] She took cytotec last night.   Review of Systems     Objective:   Physical Exam [id number] Breathing, conversing, and ambulating normally UPT negative, consent signed, Time out procedure done. Cervix prepped with betadine and grasped with a single tooth tenaculum. [facility] was easily placed and the strings were cut to 3-4 cm. Uterus sounded to 8 cm. She tolerated the procedure well although she was rather pale and feeling faint afterwards. She did well after a Sprite. Ultrasound showed correct placement      Assessment & Plan:  Contraception- Kyleena Rec back up for 2 weeks

## 2017-03-15 IMAGING — CR DG THORACIC SPINE 3V
3 series · 3 of 3 positions shown · non-contrast
Comparison: None.

CLINICAL DATA: Patient with upper and lower back pain for 1 month
with no known injury.

EXAM:
THORACIC SPINE - 3 VIEWS

[t-spine ap]
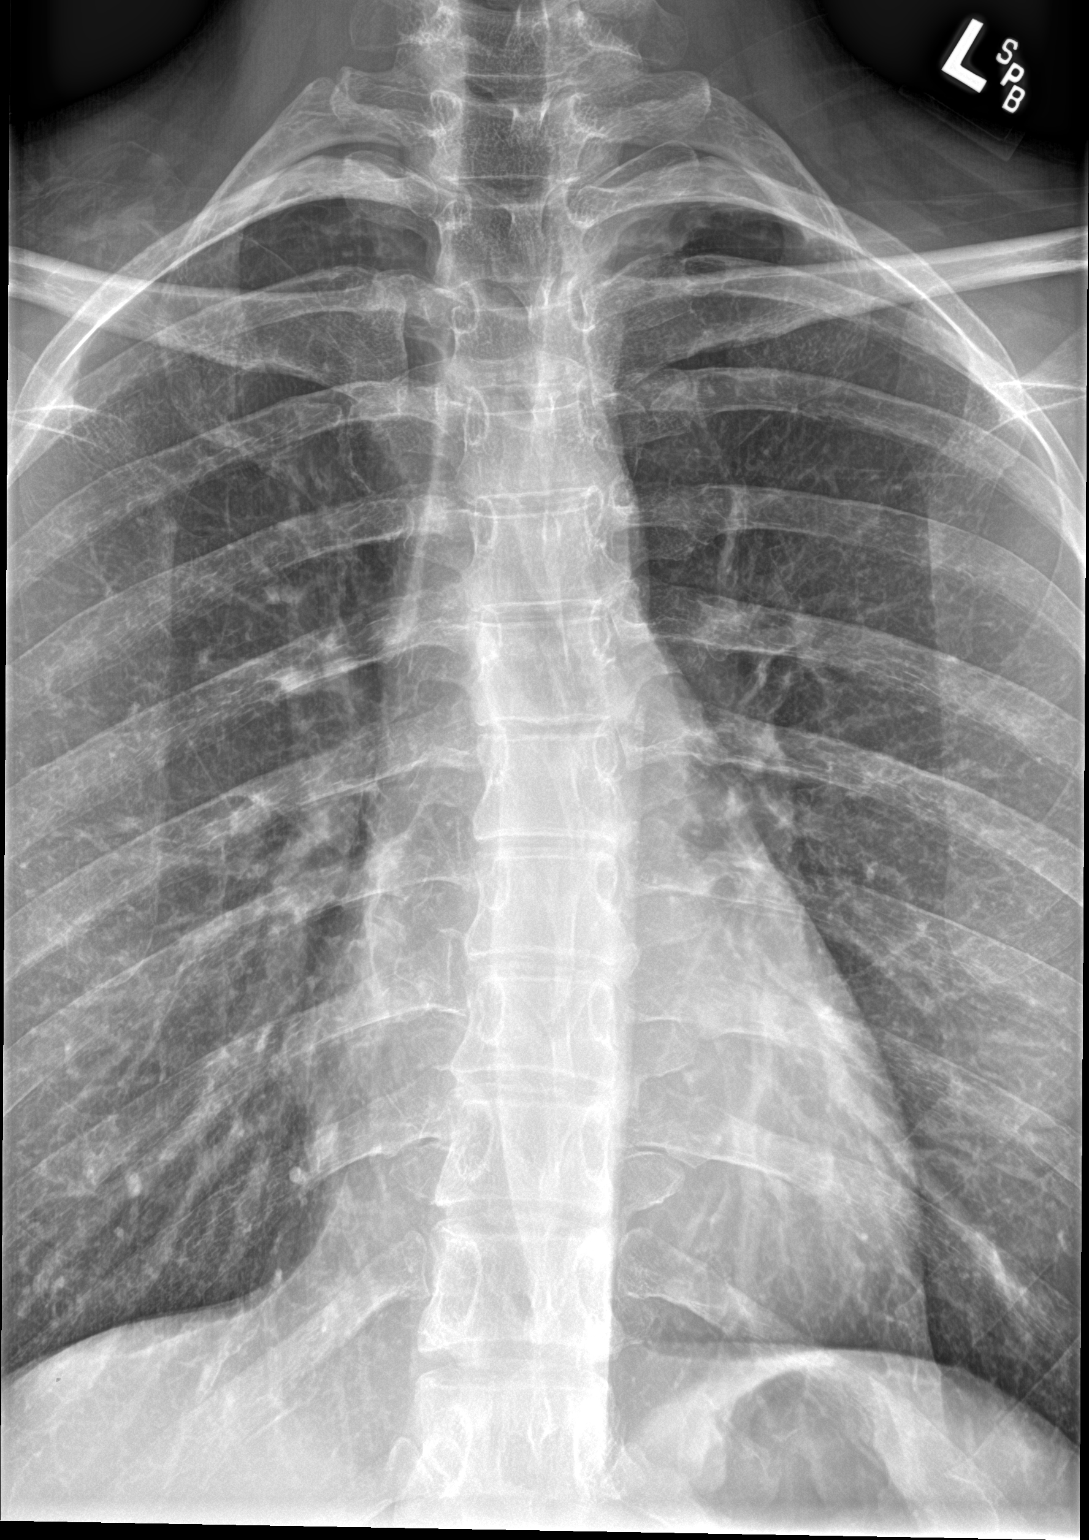

[t-spine lat]
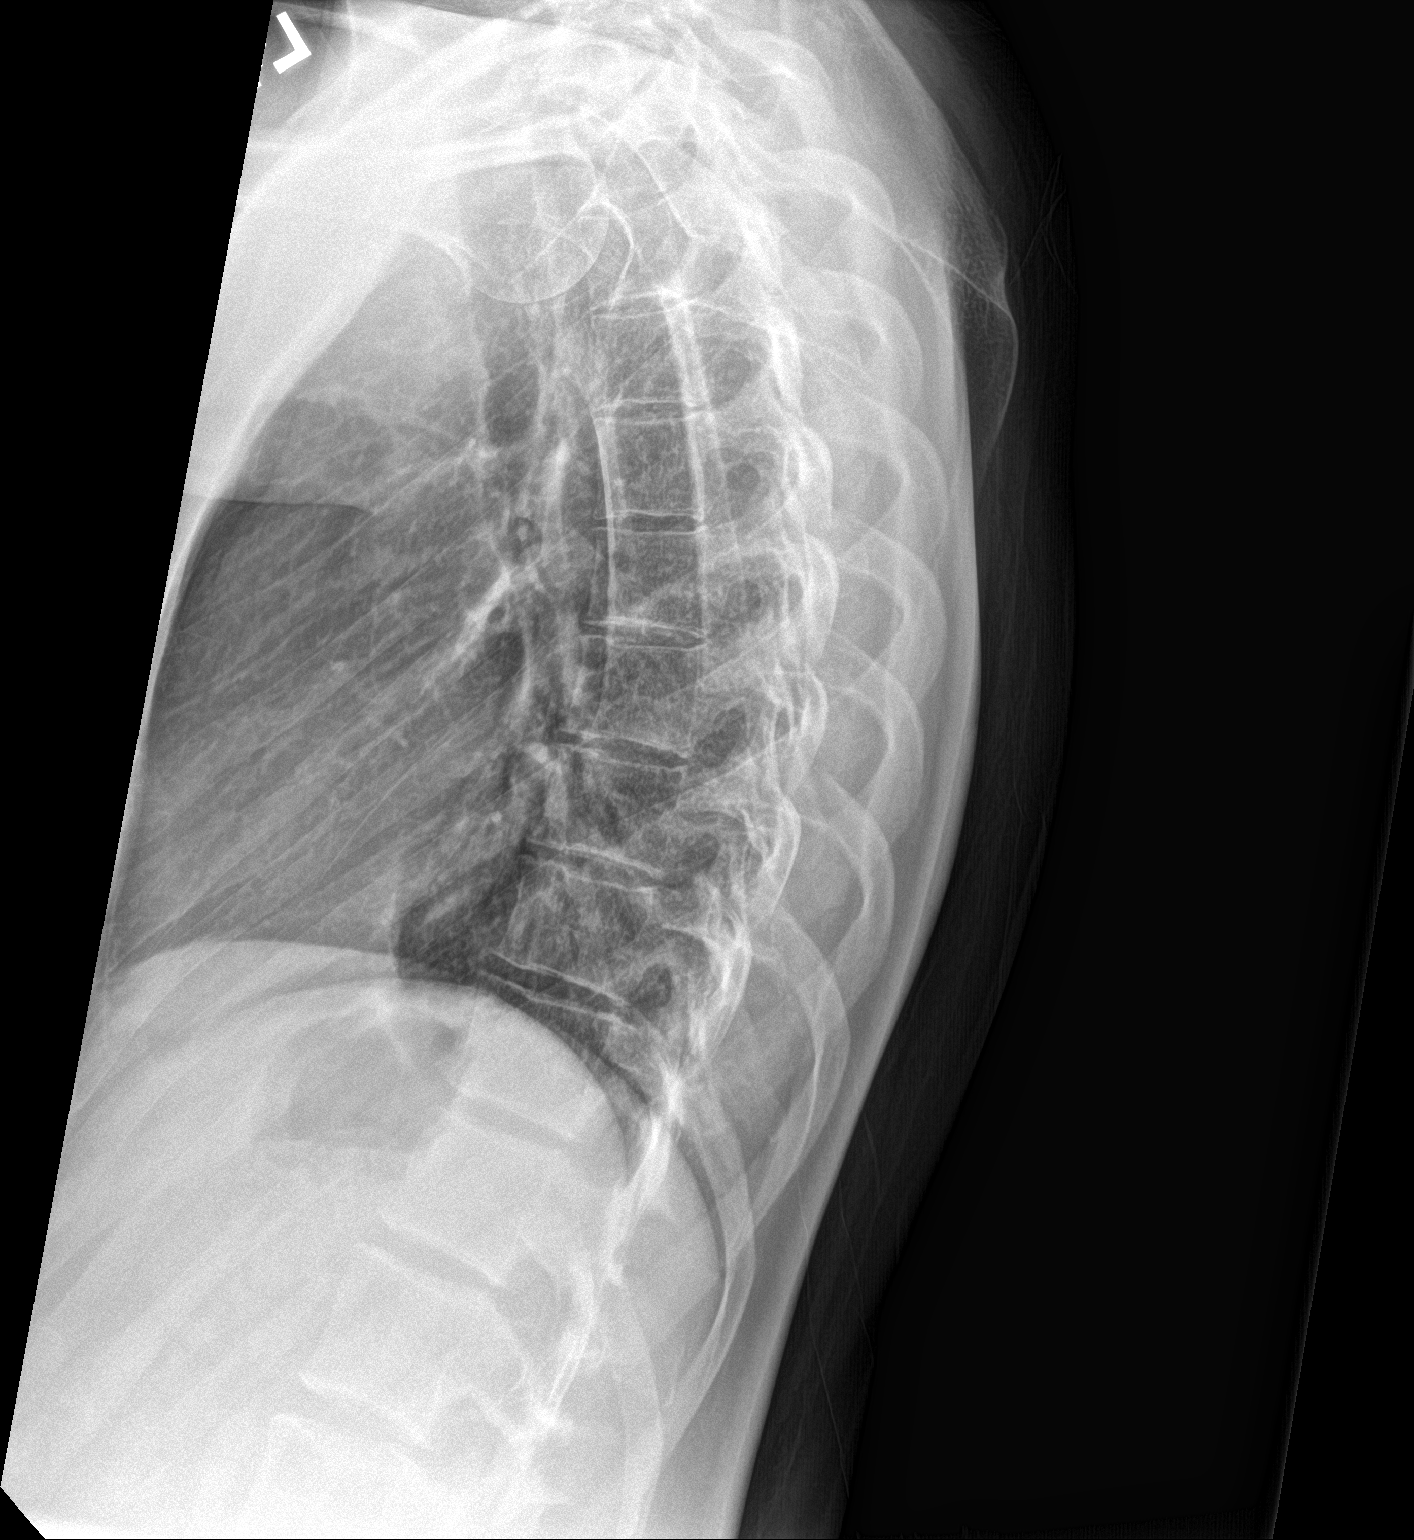

[t-spine swimmers]
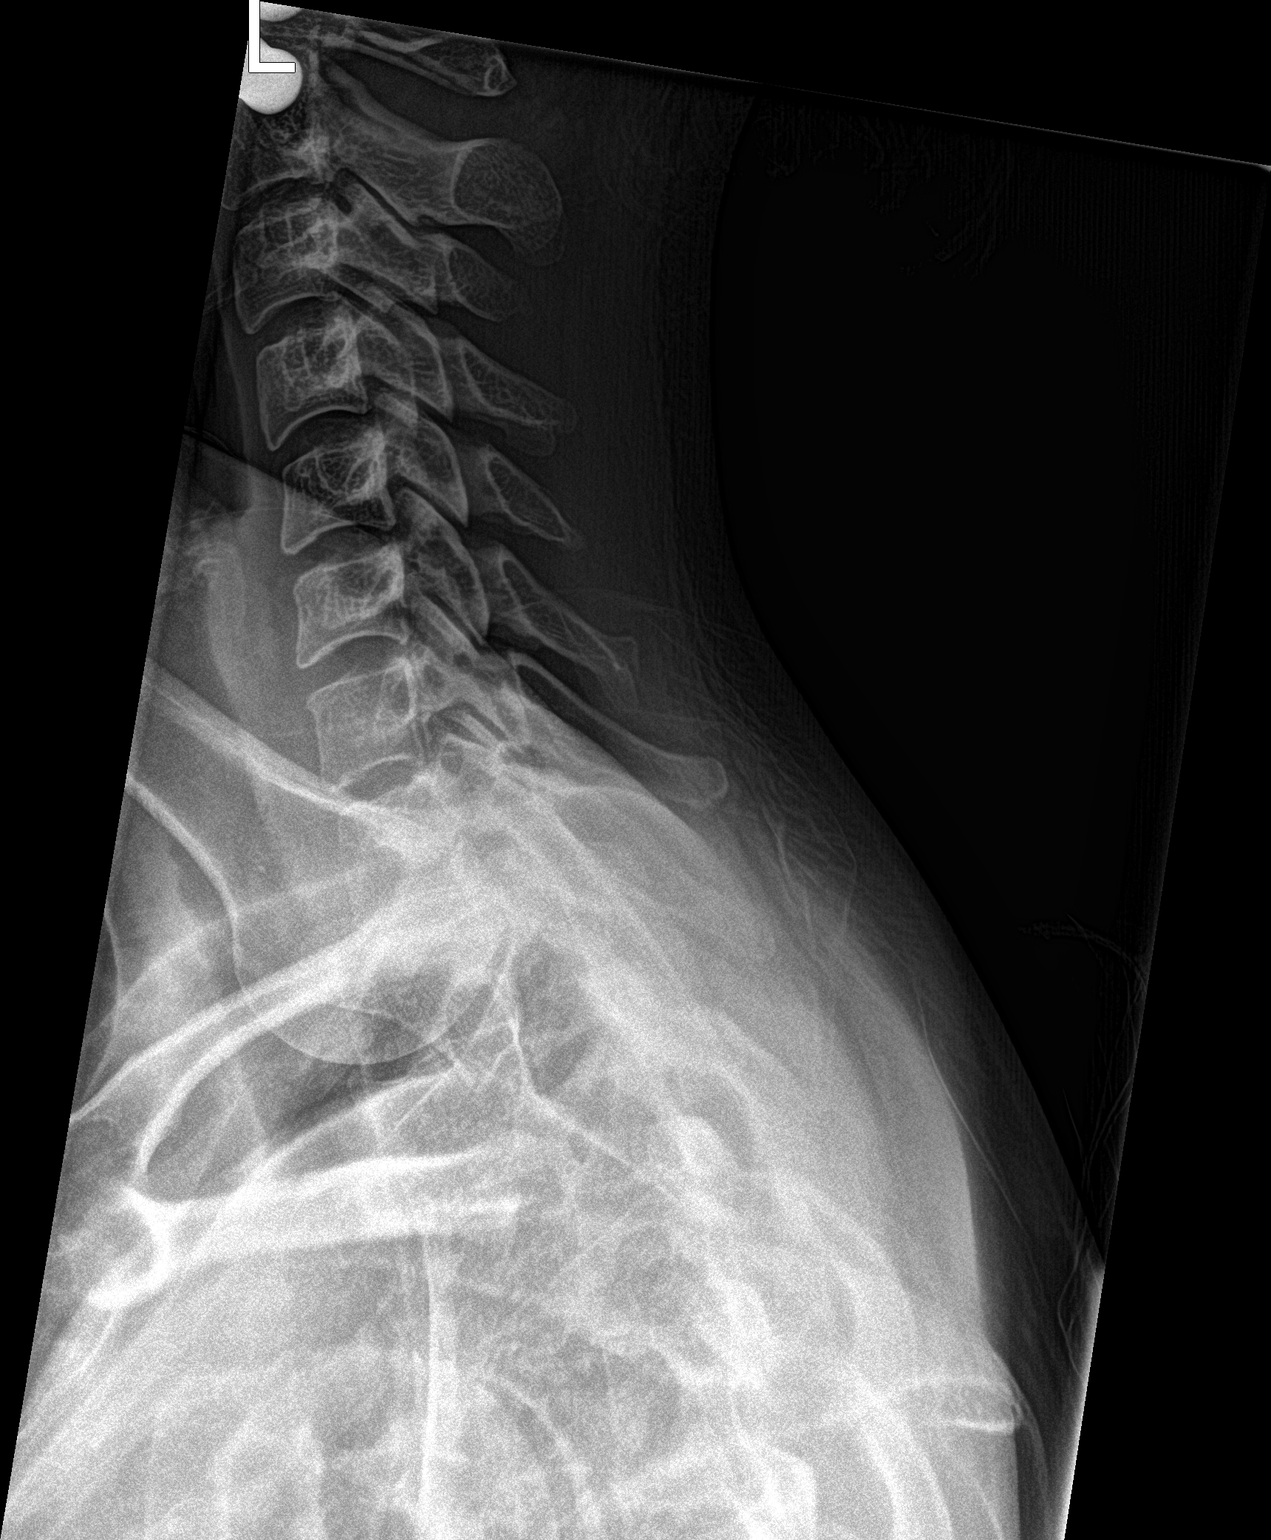

[3 of 3 positions shown; findings below may reference images not displayed]

FINDINGS: There is no evidence of thoracic spine fracture. Alignment is
normal. No other significant bone abnormalities are identified.
IMPRESSION: Negative.
